# Patient Record
Sex: Female | Born: 1940
Health system: Southern US, Community
[De-identification: ages and names within clinical notes are randomized; demographics above are authoritative.]

## PROBLEM LIST (undated history)

## (undated) DIAGNOSIS — F53 Postpartum depression: Secondary | ICD-10-CM

## (undated) DIAGNOSIS — Z5900 Homelessness unspecified: Secondary | ICD-10-CM

## (undated) DIAGNOSIS — I10 Essential (primary) hypertension: Secondary | ICD-10-CM

## (undated) DIAGNOSIS — S329XXA Fracture of unspecified parts of lumbosacral spine and pelvis, initial encounter for closed fracture: Secondary | ICD-10-CM

## (undated) DIAGNOSIS — O99345 Other mental disorders complicating the puerperium: Secondary | ICD-10-CM

## (undated) DIAGNOSIS — S72009A Fracture of unspecified part of neck of unspecified femur, initial encounter for closed fracture: Secondary | ICD-10-CM

---

## 2000-03-06 ENCOUNTER — Emergency Department (HOSPITAL_COMMUNITY): Admission: EM | Admit: 2000-03-06 | Discharge: 2000-03-06 | Payer: Self-pay | Admitting: Emergency Medicine

## 2005-03-12 ENCOUNTER — Ambulatory Visit: Payer: Self-pay | Admitting: Family Medicine

## 2006-03-28 ENCOUNTER — Emergency Department (HOSPITAL_COMMUNITY): Admission: EM | Admit: 2006-03-28 | Discharge: 2006-03-28 | Payer: Self-pay | Admitting: Emergency Medicine

## 2010-03-06 ENCOUNTER — Emergency Department: Payer: Self-pay | Admitting: Emergency Medicine

## 2015-11-08 DIAGNOSIS — F17210 Nicotine dependence, cigarettes, uncomplicated: Secondary | ICD-10-CM | POA: Diagnosis not present

## 2015-11-08 DIAGNOSIS — R9431 Abnormal electrocardiogram [ECG] [EKG]: Secondary | ICD-10-CM | POA: Diagnosis not present

## 2015-11-08 DIAGNOSIS — J069 Acute upper respiratory infection, unspecified: Secondary | ICD-10-CM | POA: Diagnosis not present

## 2015-11-08 DIAGNOSIS — R05 Cough: Secondary | ICD-10-CM | POA: Diagnosis not present

## 2015-11-08 DIAGNOSIS — R0602 Shortness of breath: Secondary | ICD-10-CM | POA: Diagnosis not present

## 2015-11-08 DIAGNOSIS — I10 Essential (primary) hypertension: Secondary | ICD-10-CM | POA: Diagnosis not present

## 2015-11-09 DIAGNOSIS — R9431 Abnormal electrocardiogram [ECG] [EKG]: Secondary | ICD-10-CM | POA: Diagnosis not present

## 2015-12-11 DIAGNOSIS — J9811 Atelectasis: Secondary | ICD-10-CM | POA: Diagnosis not present

## 2015-12-11 DIAGNOSIS — Z888 Allergy status to other drugs, medicaments and biological substances status: Secondary | ICD-10-CM | POA: Diagnosis not present

## 2015-12-11 DIAGNOSIS — Z598 Other problems related to housing and economic circumstances: Secondary | ICD-10-CM | POA: Diagnosis not present

## 2015-12-11 DIAGNOSIS — Z87891 Personal history of nicotine dependence: Secondary | ICD-10-CM | POA: Diagnosis not present

## 2015-12-11 DIAGNOSIS — Z9981 Dependence on supplemental oxygen: Secondary | ICD-10-CM | POA: Diagnosis not present

## 2015-12-11 DIAGNOSIS — M1250 Traumatic arthropathy, unspecified site: Secondary | ICD-10-CM | POA: Diagnosis not present

## 2015-12-11 DIAGNOSIS — R0902 Hypoxemia: Secondary | ICD-10-CM | POA: Diagnosis not present

## 2015-12-11 DIAGNOSIS — R0989 Other specified symptoms and signs involving the circulatory and respiratory systems: Secondary | ICD-10-CM | POA: Diagnosis not present

## 2015-12-11 DIAGNOSIS — F4310 Post-traumatic stress disorder, unspecified: Secondary | ICD-10-CM | POA: Diagnosis not present

## 2015-12-11 DIAGNOSIS — G8311 Monoplegia of lower limb affecting right dominant side: Secondary | ICD-10-CM | POA: Diagnosis not present

## 2015-12-11 DIAGNOSIS — T415X6A Underdosing of therapeutic gases, initial encounter: Secondary | ICD-10-CM | POA: Diagnosis not present

## 2015-12-13 DIAGNOSIS — I10 Essential (primary) hypertension: Secondary | ICD-10-CM | POA: Diagnosis not present

## 2015-12-13 DIAGNOSIS — R0609 Other forms of dyspnea: Secondary | ICD-10-CM | POA: Diagnosis not present

## 2015-12-13 DIAGNOSIS — R7981 Abnormal blood-gas level: Secondary | ICD-10-CM | POA: Diagnosis not present

## 2015-12-16 DIAGNOSIS — J40 Bronchitis, not specified as acute or chronic: Secondary | ICD-10-CM | POA: Diagnosis not present

## 2015-12-16 DIAGNOSIS — Z9981 Dependence on supplemental oxygen: Secondary | ICD-10-CM | POA: Diagnosis not present

## 2015-12-16 DIAGNOSIS — R918 Other nonspecific abnormal finding of lung field: Secondary | ICD-10-CM | POA: Diagnosis not present

## 2015-12-16 DIAGNOSIS — J811 Chronic pulmonary edema: Secondary | ICD-10-CM | POA: Diagnosis not present

## 2015-12-16 DIAGNOSIS — R05 Cough: Secondary | ICD-10-CM | POA: Diagnosis not present

## 2015-12-16 DIAGNOSIS — Z87891 Personal history of nicotine dependence: Secondary | ICD-10-CM | POA: Diagnosis not present

## 2015-12-16 DIAGNOSIS — R0602 Shortness of breath: Secondary | ICD-10-CM | POA: Diagnosis not present

## 2015-12-16 DIAGNOSIS — Z91120 Patient's intentional underdosing of medication regimen due to financial hardship: Secondary | ICD-10-CM | POA: Diagnosis not present

## 2015-12-16 DIAGNOSIS — Z888 Allergy status to other drugs, medicaments and biological substances status: Secondary | ICD-10-CM | POA: Diagnosis not present

## 2015-12-16 DIAGNOSIS — Z79899 Other long term (current) drug therapy: Secondary | ICD-10-CM | POA: Diagnosis not present

## 2015-12-16 DIAGNOSIS — T415X6A Underdosing of therapeutic gases, initial encounter: Secondary | ICD-10-CM | POA: Diagnosis not present

## 2015-12-20 DIAGNOSIS — J9611 Chronic respiratory failure with hypoxia: Secondary | ICD-10-CM | POA: Diagnosis not present

## 2015-12-20 DIAGNOSIS — M1990 Unspecified osteoarthritis, unspecified site: Secondary | ICD-10-CM | POA: Diagnosis not present

## 2015-12-20 DIAGNOSIS — R918 Other nonspecific abnormal finding of lung field: Secondary | ICD-10-CM | POA: Diagnosis not present

## 2016-02-14 DIAGNOSIS — Z8709 Personal history of other diseases of the respiratory system: Secondary | ICD-10-CM | POA: Diagnosis not present

## 2016-02-14 DIAGNOSIS — Z87891 Personal history of nicotine dependence: Secondary | ICD-10-CM | POA: Diagnosis not present

## 2016-02-14 DIAGNOSIS — Z139 Encounter for screening, unspecified: Secondary | ICD-10-CM | POA: Diagnosis not present

## 2016-02-14 DIAGNOSIS — Z888 Allergy status to other drugs, medicaments and biological substances status: Secondary | ICD-10-CM | POA: Diagnosis not present

## 2016-02-14 DIAGNOSIS — Z0000 Encounter for general adult medical examination without abnormal findings: Secondary | ICD-10-CM | POA: Diagnosis not present

## 2016-02-14 DIAGNOSIS — Z9981 Dependence on supplemental oxygen: Secondary | ICD-10-CM | POA: Diagnosis not present

## 2016-02-14 DIAGNOSIS — Z79899 Other long term (current) drug therapy: Secondary | ICD-10-CM | POA: Diagnosis not present

## 2016-02-18 DIAGNOSIS — G839 Paralytic syndrome, unspecified: Secondary | ICD-10-CM | POA: Diagnosis not present

## 2016-02-18 DIAGNOSIS — M1990 Unspecified osteoarthritis, unspecified site: Secondary | ICD-10-CM | POA: Diagnosis not present

## 2016-02-18 DIAGNOSIS — J9611 Chronic respiratory failure with hypoxia: Secondary | ICD-10-CM | POA: Diagnosis not present

## 2016-02-18 DIAGNOSIS — E669 Obesity, unspecified: Secondary | ICD-10-CM | POA: Diagnosis not present

## 2016-02-18 DIAGNOSIS — Z9181 History of falling: Secondary | ICD-10-CM | POA: Diagnosis not present

## 2016-02-18 DIAGNOSIS — I10 Essential (primary) hypertension: Secondary | ICD-10-CM | POA: Diagnosis not present

## 2016-03-04 DIAGNOSIS — E669 Obesity, unspecified: Secondary | ICD-10-CM | POA: Diagnosis not present

## 2016-03-04 DIAGNOSIS — M1990 Unspecified osteoarthritis, unspecified site: Secondary | ICD-10-CM | POA: Diagnosis not present

## 2016-03-04 DIAGNOSIS — Z9181 History of falling: Secondary | ICD-10-CM | POA: Diagnosis not present

## 2016-03-04 DIAGNOSIS — R0609 Other forms of dyspnea: Secondary | ICD-10-CM | POA: Diagnosis not present

## 2016-03-04 DIAGNOSIS — Z4689 Encounter for fitting and adjustment of other specified devices: Secondary | ICD-10-CM | POA: Diagnosis not present

## 2016-03-12 DIAGNOSIS — Z9119 Patient's noncompliance with other medical treatment and regimen: Secondary | ICD-10-CM | POA: Diagnosis not present

## 2016-03-12 DIAGNOSIS — J9611 Chronic respiratory failure with hypoxia: Secondary | ICD-10-CM | POA: Diagnosis not present

## 2016-03-12 DIAGNOSIS — F99 Mental disorder, not otherwise specified: Secondary | ICD-10-CM | POA: Diagnosis not present

## 2016-03-12 DIAGNOSIS — T7431XA Adult psychological abuse, confirmed, initial encounter: Secondary | ICD-10-CM | POA: Diagnosis not present

## 2016-03-12 DIAGNOSIS — I10 Essential (primary) hypertension: Secondary | ICD-10-CM | POA: Diagnosis not present

## 2016-03-28 DIAGNOSIS — I2699 Other pulmonary embolism without acute cor pulmonale: Secondary | ICD-10-CM | POA: Diagnosis not present

## 2016-03-28 DIAGNOSIS — R918 Other nonspecific abnormal finding of lung field: Secondary | ICD-10-CM | POA: Diagnosis not present

## 2016-03-28 DIAGNOSIS — Z6841 Body Mass Index (BMI) 40.0 and over, adult: Secondary | ICD-10-CM | POA: Diagnosis not present

## 2016-03-28 DIAGNOSIS — J9811 Atelectasis: Secondary | ICD-10-CM | POA: Diagnosis not present

## 2016-03-28 DIAGNOSIS — N390 Urinary tract infection, site not specified: Secondary | ICD-10-CM | POA: Diagnosis not present

## 2016-03-28 DIAGNOSIS — R600 Localized edema: Secondary | ICD-10-CM | POA: Diagnosis not present

## 2016-03-28 DIAGNOSIS — I2510 Atherosclerotic heart disease of native coronary artery without angina pectoris: Secondary | ICD-10-CM | POA: Diagnosis not present

## 2016-03-28 DIAGNOSIS — R0602 Shortness of breath: Secondary | ICD-10-CM | POA: Diagnosis not present

## 2016-03-28 DIAGNOSIS — E669 Obesity, unspecified: Secondary | ICD-10-CM | POA: Diagnosis not present

## 2016-03-28 DIAGNOSIS — I159 Secondary hypertension, unspecified: Secondary | ICD-10-CM | POA: Diagnosis not present

## 2016-03-28 DIAGNOSIS — I499 Cardiac arrhythmia, unspecified: Secondary | ICD-10-CM | POA: Diagnosis not present

## 2016-03-28 DIAGNOSIS — R0989 Other specified symptoms and signs involving the circulatory and respiratory systems: Secondary | ICD-10-CM | POA: Diagnosis not present

## 2016-03-28 DIAGNOSIS — I119 Hypertensive heart disease without heart failure: Secondary | ICD-10-CM | POA: Diagnosis not present

## 2016-03-28 DIAGNOSIS — D649 Anemia, unspecified: Secondary | ICD-10-CM | POA: Diagnosis not present

## 2016-03-28 DIAGNOSIS — M7989 Other specified soft tissue disorders: Secondary | ICD-10-CM | POA: Diagnosis not present

## 2016-03-28 DIAGNOSIS — R911 Solitary pulmonary nodule: Secondary | ICD-10-CM | POA: Diagnosis not present

## 2016-03-28 DIAGNOSIS — M12551 Traumatic arthropathy, right hip: Secondary | ICD-10-CM | POA: Diagnosis not present

## 2016-03-29 DIAGNOSIS — R0602 Shortness of breath: Secondary | ICD-10-CM | POA: Diagnosis not present

## 2016-03-29 DIAGNOSIS — M1611 Unilateral primary osteoarthritis, right hip: Secondary | ICD-10-CM | POA: Diagnosis not present

## 2016-03-29 DIAGNOSIS — I890 Lymphedema, not elsewhere classified: Secondary | ICD-10-CM | POA: Diagnosis present

## 2016-03-29 DIAGNOSIS — R911 Solitary pulmonary nodule: Secondary | ICD-10-CM | POA: Diagnosis present

## 2016-03-29 DIAGNOSIS — R600 Localized edema: Secondary | ICD-10-CM | POA: Diagnosis not present

## 2016-03-29 DIAGNOSIS — R262 Difficulty in walking, not elsewhere classified: Secondary | ICD-10-CM | POA: Diagnosis not present

## 2016-03-29 DIAGNOSIS — R41841 Cognitive communication deficit: Secondary | ICD-10-CM | POA: Diagnosis not present

## 2016-03-29 DIAGNOSIS — I159 Secondary hypertension, unspecified: Secondary | ICD-10-CM | POA: Diagnosis not present

## 2016-03-29 DIAGNOSIS — D509 Iron deficiency anemia, unspecified: Secondary | ICD-10-CM | POA: Diagnosis not present

## 2016-03-29 DIAGNOSIS — M12551 Traumatic arthropathy, right hip: Secondary | ICD-10-CM | POA: Diagnosis present

## 2016-03-29 DIAGNOSIS — Z6841 Body Mass Index (BMI) 40.0 and over, adult: Secondary | ICD-10-CM | POA: Diagnosis not present

## 2016-03-29 DIAGNOSIS — I2510 Atherosclerotic heart disease of native coronary artery without angina pectoris: Secondary | ICD-10-CM | POA: Diagnosis present

## 2016-03-29 DIAGNOSIS — N390 Urinary tract infection, site not specified: Secondary | ICD-10-CM | POA: Diagnosis present

## 2016-03-29 DIAGNOSIS — Z87891 Personal history of nicotine dependence: Secondary | ICD-10-CM | POA: Diagnosis not present

## 2016-03-29 DIAGNOSIS — M6281 Muscle weakness (generalized): Secondary | ICD-10-CM | POA: Diagnosis not present

## 2016-03-29 DIAGNOSIS — E669 Obesity, unspecified: Secondary | ICD-10-CM | POA: Diagnosis not present

## 2016-03-29 DIAGNOSIS — I119 Hypertensive heart disease without heart failure: Secondary | ICD-10-CM | POA: Diagnosis present

## 2016-03-29 DIAGNOSIS — Z888 Allergy status to other drugs, medicaments and biological substances status: Secondary | ICD-10-CM | POA: Diagnosis not present

## 2016-03-29 DIAGNOSIS — J9611 Chronic respiratory failure with hypoxia: Secondary | ICD-10-CM | POA: Diagnosis not present

## 2016-03-29 DIAGNOSIS — D649 Anemia, unspecified: Secondary | ICD-10-CM | POA: Diagnosis present

## 2016-03-29 DIAGNOSIS — R488 Other symbolic dysfunctions: Secondary | ICD-10-CM | POA: Diagnosis not present

## 2016-03-29 DIAGNOSIS — Z79899 Other long term (current) drug therapy: Secondary | ICD-10-CM | POA: Diagnosis not present

## 2016-03-29 DIAGNOSIS — I2699 Other pulmonary embolism without acute cor pulmonale: Secondary | ICD-10-CM | POA: Diagnosis present

## 2016-03-29 DIAGNOSIS — I10 Essential (primary) hypertension: Secondary | ICD-10-CM | POA: Diagnosis not present

## 2016-03-29 DIAGNOSIS — K769 Liver disease, unspecified: Secondary | ICD-10-CM | POA: Diagnosis present

## 2016-04-03 DIAGNOSIS — E669 Obesity, unspecified: Secondary | ICD-10-CM | POA: Diagnosis not present

## 2016-04-03 DIAGNOSIS — R102 Pelvic and perineal pain: Secondary | ICD-10-CM | POA: Diagnosis not present

## 2016-04-03 DIAGNOSIS — R05 Cough: Secondary | ICD-10-CM | POA: Diagnosis not present

## 2016-04-03 DIAGNOSIS — R932 Abnormal findings on diagnostic imaging of liver and biliary tract: Secondary | ICD-10-CM | POA: Diagnosis not present

## 2016-04-03 DIAGNOSIS — M79674 Pain in right toe(s): Secondary | ICD-10-CM | POA: Diagnosis not present

## 2016-04-03 DIAGNOSIS — M545 Low back pain: Secondary | ICD-10-CM | POA: Diagnosis not present

## 2016-04-03 DIAGNOSIS — R911 Solitary pulmonary nodule: Secondary | ICD-10-CM | POA: Diagnosis not present

## 2016-04-03 DIAGNOSIS — M6281 Muscle weakness (generalized): Secondary | ICD-10-CM | POA: Diagnosis not present

## 2016-04-03 DIAGNOSIS — I70203 Unspecified atherosclerosis of native arteries of extremities, bilateral legs: Secondary | ICD-10-CM | POA: Diagnosis not present

## 2016-04-03 DIAGNOSIS — I159 Secondary hypertension, unspecified: Secondary | ICD-10-CM | POA: Diagnosis not present

## 2016-04-03 DIAGNOSIS — Z87891 Personal history of nicotine dependence: Secondary | ICD-10-CM | POA: Diagnosis not present

## 2016-04-03 DIAGNOSIS — R262 Difficulty in walking, not elsewhere classified: Secondary | ICD-10-CM | POA: Diagnosis not present

## 2016-04-03 DIAGNOSIS — R0602 Shortness of breath: Secondary | ICD-10-CM | POA: Diagnosis not present

## 2016-04-03 DIAGNOSIS — R41841 Cognitive communication deficit: Secondary | ICD-10-CM | POA: Diagnosis not present

## 2016-04-03 DIAGNOSIS — G8929 Other chronic pain: Secondary | ICD-10-CM | POA: Diagnosis not present

## 2016-04-03 DIAGNOSIS — M1611 Unilateral primary osteoarthritis, right hip: Secondary | ICD-10-CM | POA: Diagnosis not present

## 2016-04-03 DIAGNOSIS — I2699 Other pulmonary embolism without acute cor pulmonale: Secondary | ICD-10-CM | POA: Diagnosis not present

## 2016-04-03 DIAGNOSIS — I10 Essential (primary) hypertension: Secondary | ICD-10-CM | POA: Diagnosis not present

## 2016-04-03 DIAGNOSIS — R488 Other symbolic dysfunctions: Secondary | ICD-10-CM | POA: Diagnosis not present

## 2016-04-03 DIAGNOSIS — M1990 Unspecified osteoarthritis, unspecified site: Secondary | ICD-10-CM | POA: Diagnosis not present

## 2016-04-03 DIAGNOSIS — B351 Tinea unguium: Secondary | ICD-10-CM | POA: Diagnosis not present

## 2016-04-03 DIAGNOSIS — J9611 Chronic respiratory failure with hypoxia: Secondary | ICD-10-CM | POA: Diagnosis not present

## 2016-04-03 DIAGNOSIS — R600 Localized edema: Secondary | ICD-10-CM | POA: Diagnosis not present

## 2016-04-04 DIAGNOSIS — I159 Secondary hypertension, unspecified: Secondary | ICD-10-CM | POA: Diagnosis not present

## 2016-04-04 DIAGNOSIS — I2699 Other pulmonary embolism without acute cor pulmonale: Secondary | ICD-10-CM | POA: Diagnosis not present

## 2016-04-07 DIAGNOSIS — M1611 Unilateral primary osteoarthritis, right hip: Secondary | ICD-10-CM | POA: Diagnosis not present

## 2016-04-07 DIAGNOSIS — R102 Pelvic and perineal pain: Secondary | ICD-10-CM | POA: Diagnosis not present

## 2016-04-07 DIAGNOSIS — M545 Low back pain: Secondary | ICD-10-CM | POA: Diagnosis not present

## 2016-04-07 DIAGNOSIS — G8929 Other chronic pain: Secondary | ICD-10-CM | POA: Diagnosis not present

## 2016-04-07 DIAGNOSIS — M1990 Unspecified osteoarthritis, unspecified site: Secondary | ICD-10-CM | POA: Diagnosis not present

## 2016-04-08 DIAGNOSIS — I2699 Other pulmonary embolism without acute cor pulmonale: Secondary | ICD-10-CM | POA: Diagnosis not present

## 2016-04-08 DIAGNOSIS — I10 Essential (primary) hypertension: Secondary | ICD-10-CM | POA: Diagnosis not present

## 2016-04-08 DIAGNOSIS — M1611 Unilateral primary osteoarthritis, right hip: Secondary | ICD-10-CM | POA: Diagnosis not present

## 2016-04-08 DIAGNOSIS — J9611 Chronic respiratory failure with hypoxia: Secondary | ICD-10-CM | POA: Diagnosis not present

## 2016-04-09 DIAGNOSIS — M1611 Unilateral primary osteoarthritis, right hip: Secondary | ICD-10-CM | POA: Diagnosis not present

## 2016-04-21 DIAGNOSIS — I10 Essential (primary) hypertension: Secondary | ICD-10-CM | POA: Diagnosis not present

## 2016-04-21 DIAGNOSIS — I2699 Other pulmonary embolism without acute cor pulmonale: Secondary | ICD-10-CM | POA: Diagnosis not present

## 2016-04-21 DIAGNOSIS — R932 Abnormal findings on diagnostic imaging of liver and biliary tract: Secondary | ICD-10-CM | POA: Diagnosis not present

## 2016-04-21 DIAGNOSIS — R600 Localized edema: Secondary | ICD-10-CM | POA: Diagnosis not present

## 2016-04-25 DIAGNOSIS — B351 Tinea unguium: Secondary | ICD-10-CM | POA: Diagnosis not present

## 2016-04-25 DIAGNOSIS — R262 Difficulty in walking, not elsewhere classified: Secondary | ICD-10-CM | POA: Diagnosis not present

## 2016-04-25 DIAGNOSIS — M79674 Pain in right toe(s): Secondary | ICD-10-CM | POA: Diagnosis not present

## 2016-04-25 DIAGNOSIS — I70203 Unspecified atherosclerosis of native arteries of extremities, bilateral legs: Secondary | ICD-10-CM | POA: Diagnosis not present

## 2016-05-19 DIAGNOSIS — R05 Cough: Secondary | ICD-10-CM | POA: Diagnosis not present

## 2016-05-21 DIAGNOSIS — R932 Abnormal findings on diagnostic imaging of liver and biliary tract: Secondary | ICD-10-CM | POA: Diagnosis not present

## 2016-05-21 DIAGNOSIS — R911 Solitary pulmonary nodule: Secondary | ICD-10-CM | POA: Diagnosis not present

## 2016-05-21 DIAGNOSIS — M1611 Unilateral primary osteoarthritis, right hip: Secondary | ICD-10-CM | POA: Diagnosis not present

## 2016-05-21 DIAGNOSIS — I10 Essential (primary) hypertension: Secondary | ICD-10-CM | POA: Diagnosis not present

## 2016-06-04 DIAGNOSIS — I498 Other specified cardiac arrhythmias: Secondary | ICD-10-CM | POA: Diagnosis not present

## 2016-06-04 DIAGNOSIS — R109 Unspecified abdominal pain: Secondary | ICD-10-CM | POA: Diagnosis not present

## 2016-06-04 DIAGNOSIS — R300 Dysuria: Secondary | ICD-10-CM | POA: Diagnosis not present

## 2016-06-04 DIAGNOSIS — F4310 Post-traumatic stress disorder, unspecified: Secondary | ICD-10-CM | POA: Diagnosis not present

## 2016-06-04 DIAGNOSIS — I10 Essential (primary) hypertension: Secondary | ICD-10-CM | POA: Diagnosis not present

## 2016-06-04 DIAGNOSIS — R9431 Abnormal electrocardiogram [ECG] [EKG]: Secondary | ICD-10-CM | POA: Diagnosis not present

## 2016-06-04 DIAGNOSIS — R0602 Shortness of breath: Secondary | ICD-10-CM | POA: Diagnosis not present

## 2016-06-04 DIAGNOSIS — R001 Bradycardia, unspecified: Secondary | ICD-10-CM | POA: Diagnosis not present

## 2016-06-04 DIAGNOSIS — F17210 Nicotine dependence, cigarettes, uncomplicated: Secondary | ICD-10-CM | POA: Diagnosis not present

## 2016-06-04 DIAGNOSIS — I499 Cardiac arrhythmia, unspecified: Secondary | ICD-10-CM | POA: Diagnosis not present

## 2016-06-15 DIAGNOSIS — M79605 Pain in left leg: Secondary | ICD-10-CM | POA: Diagnosis not present

## 2016-06-15 DIAGNOSIS — M7989 Other specified soft tissue disorders: Secondary | ICD-10-CM | POA: Diagnosis not present

## 2016-06-15 DIAGNOSIS — Z79899 Other long term (current) drug therapy: Secondary | ICD-10-CM | POA: Diagnosis not present

## 2016-06-15 DIAGNOSIS — Z87891 Personal history of nicotine dependence: Secondary | ICD-10-CM | POA: Diagnosis not present

## 2016-06-15 DIAGNOSIS — G839 Paralytic syndrome, unspecified: Secondary | ICD-10-CM | POA: Diagnosis not present

## 2016-06-15 DIAGNOSIS — M79606 Pain in leg, unspecified: Secondary | ICD-10-CM | POA: Diagnosis not present

## 2016-06-15 DIAGNOSIS — M79604 Pain in right leg: Secondary | ICD-10-CM | POA: Diagnosis not present

## 2016-06-15 DIAGNOSIS — G8929 Other chronic pain: Secondary | ICD-10-CM | POA: Diagnosis not present

## 2016-06-15 DIAGNOSIS — M1990 Unspecified osteoarthritis, unspecified site: Secondary | ICD-10-CM | POA: Diagnosis not present

## 2016-06-15 DIAGNOSIS — Z888 Allergy status to other drugs, medicaments and biological substances status: Secondary | ICD-10-CM | POA: Diagnosis not present

## 2016-06-17 DIAGNOSIS — G8191 Hemiplegia, unspecified affecting right dominant side: Secondary | ICD-10-CM | POA: Diagnosis not present

## 2016-06-17 DIAGNOSIS — M1990 Unspecified osteoarthritis, unspecified site: Secondary | ICD-10-CM | POA: Diagnosis not present

## 2016-06-17 DIAGNOSIS — Z791 Long term (current) use of non-steroidal anti-inflammatories (NSAID): Secondary | ICD-10-CM | POA: Diagnosis not present

## 2016-06-17 DIAGNOSIS — J069 Acute upper respiratory infection, unspecified: Secondary | ICD-10-CM | POA: Diagnosis not present

## 2016-06-17 DIAGNOSIS — Z87891 Personal history of nicotine dependence: Secondary | ICD-10-CM | POA: Diagnosis not present

## 2016-06-17 DIAGNOSIS — F4311 Post-traumatic stress disorder, acute: Secondary | ICD-10-CM | POA: Diagnosis not present

## 2016-06-17 DIAGNOSIS — J3489 Other specified disorders of nose and nasal sinuses: Secondary | ICD-10-CM | POA: Diagnosis not present

## 2016-06-17 DIAGNOSIS — R05 Cough: Secondary | ICD-10-CM | POA: Diagnosis not present

## 2016-06-17 DIAGNOSIS — F4310 Post-traumatic stress disorder, unspecified: Secondary | ICD-10-CM | POA: Diagnosis not present

## 2016-06-17 DIAGNOSIS — Z9981 Dependence on supplemental oxygen: Secondary | ICD-10-CM | POA: Diagnosis not present

## 2016-06-17 DIAGNOSIS — Z79891 Long term (current) use of opiate analgesic: Secondary | ICD-10-CM | POA: Diagnosis not present

## 2016-06-17 DIAGNOSIS — I517 Cardiomegaly: Secondary | ICD-10-CM | POA: Diagnosis not present

## 2016-06-17 DIAGNOSIS — Z79899 Other long term (current) drug therapy: Secondary | ICD-10-CM | POA: Diagnosis not present

## 2016-06-24 DIAGNOSIS — M1990 Unspecified osteoarthritis, unspecified site: Secondary | ICD-10-CM | POA: Diagnosis not present

## 2016-06-24 DIAGNOSIS — Z4689 Encounter for fitting and adjustment of other specified devices: Secondary | ICD-10-CM | POA: Diagnosis not present

## 2016-06-24 DIAGNOSIS — Z6841 Body Mass Index (BMI) 40.0 and over, adult: Secondary | ICD-10-CM | POA: Diagnosis not present

## 2016-06-24 DIAGNOSIS — E669 Obesity, unspecified: Secondary | ICD-10-CM | POA: Diagnosis not present

## 2016-06-24 DIAGNOSIS — R0602 Shortness of breath: Secondary | ICD-10-CM | POA: Diagnosis not present

## 2016-06-26 DIAGNOSIS — Z760 Encounter for issue of repeat prescription: Secondary | ICD-10-CM | POA: Diagnosis not present

## 2016-06-26 DIAGNOSIS — M1990 Unspecified osteoarthritis, unspecified site: Secondary | ICD-10-CM | POA: Diagnosis not present

## 2016-06-26 DIAGNOSIS — Z888 Allergy status to other drugs, medicaments and biological substances status: Secondary | ICD-10-CM | POA: Diagnosis not present

## 2016-06-26 DIAGNOSIS — M25552 Pain in left hip: Secondary | ICD-10-CM | POA: Diagnosis not present

## 2016-06-26 DIAGNOSIS — M25562 Pain in left knee: Secondary | ICD-10-CM | POA: Diagnosis not present

## 2016-06-26 DIAGNOSIS — F4310 Post-traumatic stress disorder, unspecified: Secondary | ICD-10-CM | POA: Diagnosis not present

## 2016-06-26 DIAGNOSIS — M25561 Pain in right knee: Secondary | ICD-10-CM | POA: Diagnosis not present

## 2016-06-26 DIAGNOSIS — G8929 Other chronic pain: Secondary | ICD-10-CM | POA: Diagnosis not present

## 2016-06-26 DIAGNOSIS — M25551 Pain in right hip: Secondary | ICD-10-CM | POA: Diagnosis not present

## 2016-06-27 DIAGNOSIS — F4310 Post-traumatic stress disorder, unspecified: Secondary | ICD-10-CM | POA: Diagnosis not present

## 2016-06-27 DIAGNOSIS — F419 Anxiety disorder, unspecified: Secondary | ICD-10-CM | POA: Diagnosis not present

## 2016-06-27 DIAGNOSIS — F17210 Nicotine dependence, cigarettes, uncomplicated: Secondary | ICD-10-CM | POA: Diagnosis not present

## 2016-06-27 DIAGNOSIS — I10 Essential (primary) hypertension: Secondary | ICD-10-CM | POA: Diagnosis not present

## 2016-06-27 DIAGNOSIS — R05 Cough: Secondary | ICD-10-CM | POA: Diagnosis not present

## 2016-06-28 DIAGNOSIS — F419 Anxiety disorder, unspecified: Secondary | ICD-10-CM | POA: Diagnosis not present

## 2016-06-28 DIAGNOSIS — R05 Cough: Secondary | ICD-10-CM | POA: Diagnosis not present

## 2016-07-07 DIAGNOSIS — Z9981 Dependence on supplemental oxygen: Secondary | ICD-10-CM | POA: Diagnosis not present

## 2016-07-07 DIAGNOSIS — Z888 Allergy status to other drugs, medicaments and biological substances status: Secondary | ICD-10-CM | POA: Diagnosis not present

## 2016-07-07 DIAGNOSIS — S81801A Unspecified open wound, right lower leg, initial encounter: Secondary | ICD-10-CM | POA: Diagnosis not present

## 2016-07-07 DIAGNOSIS — M1250 Traumatic arthropathy, unspecified site: Secondary | ICD-10-CM | POA: Diagnosis not present

## 2016-07-07 DIAGNOSIS — T1490 Injury, unspecified: Secondary | ICD-10-CM | POA: Diagnosis not present

## 2016-07-07 DIAGNOSIS — Z79899 Other long term (current) drug therapy: Secondary | ICD-10-CM | POA: Diagnosis not present

## 2016-07-07 DIAGNOSIS — G8311 Monoplegia of lower limb affecting right dominant side: Secondary | ICD-10-CM | POA: Diagnosis not present

## 2016-07-07 DIAGNOSIS — M7989 Other specified soft tissue disorders: Secondary | ICD-10-CM | POA: Diagnosis not present

## 2016-07-07 DIAGNOSIS — F4310 Post-traumatic stress disorder, unspecified: Secondary | ICD-10-CM | POA: Diagnosis not present

## 2016-07-07 DIAGNOSIS — J40 Bronchitis, not specified as acute or chronic: Secondary | ICD-10-CM | POA: Diagnosis not present

## 2016-07-07 DIAGNOSIS — Z87891 Personal history of nicotine dependence: Secondary | ICD-10-CM | POA: Diagnosis not present

## 2016-07-07 DIAGNOSIS — R531 Weakness: Secondary | ICD-10-CM | POA: Diagnosis not present

## 2016-07-08 DIAGNOSIS — M7989 Other specified soft tissue disorders: Secondary | ICD-10-CM | POA: Diagnosis not present

## 2016-08-09 DIAGNOSIS — M1990 Unspecified osteoarthritis, unspecified site: Secondary | ICD-10-CM | POA: Diagnosis not present

## 2016-08-09 DIAGNOSIS — S3993XA Unspecified injury of pelvis, initial encounter: Secondary | ICD-10-CM | POA: Diagnosis not present

## 2016-08-09 DIAGNOSIS — Z87891 Personal history of nicotine dependence: Secondary | ICD-10-CM | POA: Diagnosis not present

## 2016-08-09 DIAGNOSIS — S80811A Abrasion, right lower leg, initial encounter: Secondary | ICD-10-CM | POA: Diagnosis not present

## 2016-08-09 DIAGNOSIS — F4310 Post-traumatic stress disorder, unspecified: Secondary | ICD-10-CM | POA: Diagnosis not present

## 2016-08-09 DIAGNOSIS — I10 Essential (primary) hypertension: Secondary | ICD-10-CM | POA: Diagnosis not present

## 2016-08-09 DIAGNOSIS — M25551 Pain in right hip: Secondary | ICD-10-CM | POA: Diagnosis not present

## 2016-08-09 DIAGNOSIS — M545 Low back pain: Secondary | ICD-10-CM | POA: Diagnosis not present

## 2016-08-15 DIAGNOSIS — I10 Essential (primary) hypertension: Secondary | ICD-10-CM | POA: Diagnosis not present

## 2016-08-15 DIAGNOSIS — Z87891 Personal history of nicotine dependence: Secondary | ICD-10-CM | POA: Diagnosis not present

## 2016-08-15 DIAGNOSIS — F4310 Post-traumatic stress disorder, unspecified: Secondary | ICD-10-CM | POA: Diagnosis not present

## 2016-08-15 DIAGNOSIS — Z5189 Encounter for other specified aftercare: Secondary | ICD-10-CM | POA: Diagnosis not present

## 2016-08-15 DIAGNOSIS — Z4800 Encounter for change or removal of nonsurgical wound dressing: Secondary | ICD-10-CM | POA: Diagnosis not present

## 2016-08-28 DIAGNOSIS — Z993 Dependence on wheelchair: Secondary | ICD-10-CM | POA: Diagnosis not present

## 2016-08-28 DIAGNOSIS — Z87891 Personal history of nicotine dependence: Secondary | ICD-10-CM | POA: Diagnosis not present

## 2016-08-28 DIAGNOSIS — G8191 Hemiplegia, unspecified affecting right dominant side: Secondary | ICD-10-CM | POA: Diagnosis not present

## 2016-08-28 DIAGNOSIS — Z888 Allergy status to other drugs, medicaments and biological substances status: Secondary | ICD-10-CM | POA: Diagnosis not present

## 2016-08-28 DIAGNOSIS — Z79899 Other long term (current) drug therapy: Secondary | ICD-10-CM | POA: Diagnosis not present

## 2016-08-28 DIAGNOSIS — Z6841 Body Mass Index (BMI) 40.0 and over, adult: Secondary | ICD-10-CM | POA: Diagnosis not present

## 2016-08-28 DIAGNOSIS — I10 Essential (primary) hypertension: Secondary | ICD-10-CM | POA: Diagnosis not present

## 2016-08-30 DIAGNOSIS — S80811A Abrasion, right lower leg, initial encounter: Secondary | ICD-10-CM | POA: Diagnosis not present

## 2016-08-30 DIAGNOSIS — Z87891 Personal history of nicotine dependence: Secondary | ICD-10-CM | POA: Diagnosis not present

## 2016-08-30 DIAGNOSIS — M79604 Pain in right leg: Secondary | ICD-10-CM | POA: Diagnosis not present

## 2016-08-30 DIAGNOSIS — I10 Essential (primary) hypertension: Secondary | ICD-10-CM | POA: Diagnosis not present

## 2016-08-30 DIAGNOSIS — F4310 Post-traumatic stress disorder, unspecified: Secondary | ICD-10-CM | POA: Diagnosis not present

## 2016-09-01 ENCOUNTER — Emergency Department (HOSPITAL_COMMUNITY)
Admission: EM | Admit: 2016-09-01 | Discharge: 2016-09-02 | Disposition: A | Discharge status: Home / Self Care | Payer: Medicare Other | Attending: Emergency Medicine | Admitting: Emergency Medicine

## 2016-09-01 ENCOUNTER — Encounter (HOSPITAL_COMMUNITY): Payer: Self-pay

## 2016-09-01 DIAGNOSIS — R32 Unspecified urinary incontinence: Secondary | ICD-10-CM | POA: Diagnosis not present

## 2016-09-01 HISTORY — DX: Fracture of unspecified parts of lumbosacral spine and pelvis, initial encounter for closed fracture: S32.9XXA

## 2016-09-01 HISTORY — DX: Fracture of unspecified part of neck of unspecified femur, initial encounter for closed fracture: S72.009A

## 2016-09-01 LAB — URINALYSIS, ROUTINE W REFLEX MICROSCOPIC
BILIRUBIN URINE: NEGATIVE
GLUCOSE, UA: NEGATIVE mg/dL
HGB URINE DIPSTICK: NEGATIVE
Ketones, ur: NEGATIVE mg/dL
Leukocytes, UA: NEGATIVE
Nitrite: NEGATIVE
PROTEIN: NEGATIVE mg/dL
Specific Gravity, Urine: 1.023 (ref 1.005–1.030)
pH: 6.5 (ref 5.0–8.0)

## 2016-09-01 NOTE — ED Provider Notes (Signed)
[facility]-EMERGENCY DEPT Provider Note   CSN: [id number] Arrival date & time: 09/01/16  2054     History   Chief Complaint Chief Complaint  Patient presents with  . Urinary Incontinence    HPI Beth Hall is a 75 y.o. female.  Patient states that she has an odor related to urinary incontinence. Claims that they will not let her ride on the bus. Patient recently came [place] by [employer] train and was trying take bus back to her [place] in [place]. Patient states that she has no family that she gets along with. Patient states that she's currently stranded here. Once suspended night in the hospital. Patient denies any other significant symptoms.      Past Medical History:  Diagnosis Date  . Hip fracture (HCC)   . Pelvic fracture (HCC)     There are no active problems to display for this patient.   History reviewed. No pertinent surgical history.  OB History    No data available       Home Medications    Prior to Admission medications   Not on File    Family History No family history on file.  Social History Social History  Substance Use Topics  . Smoking status: Not on file  . Smokeless tobacco: Not on file  . Alcohol use Not on file     Allergies   Patient has no allergy information on record.   Review of Systems Review of Systems  Constitutional: Negative for fever.  HENT: Negative for congestion.   Eyes: Negative for redness.  Respiratory: Negative for shortness of breath.   Cardiovascular: Negative for chest pain.  Gastrointestinal: Negative for abdominal pain.  Genitourinary: Negative for hematuria, vaginal bleeding and vaginal discharge.  Musculoskeletal: Negative for back pain.  Neurological: Negative for headaches.  Hematological: Does not bruise/bleed easily.     Physical Exam Updated Vital Signs BP 173/98 (BP Location: Right Arm)   Pulse 78   Temp 97.9 F (36.6 C) (Oral)   Resp 18   Ht 5\' 3"  (1.6 m)   Wt 113.4 kg   SpO2 94%    BMI 44.29 kg/m   Physical Exam  Constitutional: She appears well-developed and well-nourished. No distress.  HENT:  Head: Normocephalic and atraumatic.  Mouth/Throat: Oropharynx is clear and moist.  Eyes: Conjunctivae and EOM are normal. Pupils are equal, round, and reactive to light.  Neck: Normal range of motion. Neck supple.  Cardiovascular: Normal rate and regular rhythm.   Pulmonary/Chest: Effort normal and breath sounds normal. No respiratory distress.  Abdominal: Soft. Bowel sounds are normal. There is no tenderness.  Genitourinary: No vaginal discharge found.  Genitourinary Comments: No significant uterine prolapse. No skin breakdown in the vulvar area or in the creases of the thigh or abdomen. No discharge no vaginal bleeding.  Musculoskeletal: Normal range of motion. She exhibits edema.  She with the some skin tears where she bumped into her scooter chair on the right leg. Mild erythema to the left leg. No significant cellulitis.  Nursing note and vitals reviewed.    ED Treatments / Results  Labs (all labs ordered are listed, but only abnormal results are displayed) Labs Reviewed  URINALYSIS, ROUTINE W REFLEX MICROSCOPIC (NOT AT [facility])    EKG  EKG Interpretation None       Radiology No results found.  Procedures Procedures (including critical care time)  Medications Ordered in ED Medications - No data to display   Initial Impression / Assessment and Plan /  ED Course  I have reviewed the triage vital signs and the nursing notes.  Pertinent labs & imaging results that were available during my care of the patient were reviewed by me and considered in my medical decision making (see chart for details).  Clinical Course     Urinalysis negative for urinary tract infection. No significant skin breakdown. Patient is wearing a depends. There is some urine in there consistent with her history of urinary incontinence. Patient needs follow-up with GYN. States that  [place] in the [place]. [facility]. Nothing really on our chart review database for her being seen here. Patient would be given not clean depends. The chart is the CFO social worker can make contact with her. Patient stable for discharge home. No evidence of any significant uterine prolapse.  Final Clinical Impressions(s) / ED Diagnoses   Final diagnoses:  Urinary incontinence, unspecified type    New Prescriptions New Prescriptions   No medications on file     Vanetta MuldersScott Yossef Gilkison, MD 09/02/16 0004

## 2016-09-01 NOTE — Discharge Instructions (Signed)
No evidence of urinary tract infection. No significant breakdown from the urinary incontinence. Would recommend follow-up with GYN.  Charge nurse is going to see if they can arrange the social service follow-up for you.
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
## 2016-09-04 DIAGNOSIS — M25561 Pain in right knee: Secondary | ICD-10-CM | POA: Diagnosis not present

## 2016-09-04 DIAGNOSIS — I10 Essential (primary) hypertension: Secondary | ICD-10-CM | POA: Diagnosis not present

## 2016-09-04 DIAGNOSIS — Z87891 Personal history of nicotine dependence: Secondary | ICD-10-CM | POA: Diagnosis not present

## 2016-09-04 DIAGNOSIS — G8929 Other chronic pain: Secondary | ICD-10-CM | POA: Diagnosis not present

## 2016-09-04 DIAGNOSIS — M7651 Patellar tendinitis, right knee: Secondary | ICD-10-CM | POA: Diagnosis not present

## 2016-09-05 DIAGNOSIS — G8929 Other chronic pain: Secondary | ICD-10-CM | POA: Diagnosis not present

## 2016-09-05 DIAGNOSIS — M25561 Pain in right knee: Secondary | ICD-10-CM | POA: Diagnosis not present

## 2016-09-17 DIAGNOSIS — K769 Liver disease, unspecified: Secondary | ICD-10-CM | POA: Diagnosis not present

## 2016-09-17 DIAGNOSIS — Z23 Encounter for immunization: Secondary | ICD-10-CM | POA: Diagnosis not present

## 2016-09-17 DIAGNOSIS — R932 Abnormal findings on diagnostic imaging of liver and biliary tract: Secondary | ICD-10-CM | POA: Diagnosis not present

## 2016-09-17 DIAGNOSIS — Z7901 Long term (current) use of anticoagulants: Secondary | ICD-10-CM | POA: Diagnosis not present

## 2016-09-17 DIAGNOSIS — R0789 Other chest pain: Secondary | ICD-10-CM | POA: Diagnosis not present

## 2016-09-17 DIAGNOSIS — R079 Chest pain, unspecified: Secondary | ICD-10-CM | POA: Diagnosis not present

## 2016-09-17 DIAGNOSIS — I10 Essential (primary) hypertension: Secondary | ICD-10-CM | POA: Diagnosis not present

## 2016-09-17 DIAGNOSIS — D509 Iron deficiency anemia, unspecified: Secondary | ICD-10-CM | POA: Diagnosis not present

## 2016-09-17 DIAGNOSIS — R0782 Intercostal pain: Secondary | ICD-10-CM | POA: Diagnosis not present

## 2016-09-17 DIAGNOSIS — Z86711 Personal history of pulmonary embolism: Secondary | ICD-10-CM | POA: Diagnosis not present

## 2016-09-17 DIAGNOSIS — Z87891 Personal history of nicotine dependence: Secondary | ICD-10-CM | POA: Diagnosis not present

## 2016-09-17 DIAGNOSIS — R918 Other nonspecific abnormal finding of lung field: Secondary | ICD-10-CM | POA: Diagnosis not present

## 2016-09-17 DIAGNOSIS — F4310 Post-traumatic stress disorder, unspecified: Secondary | ICD-10-CM | POA: Diagnosis not present

## 2016-09-18 DIAGNOSIS — Z7901 Long term (current) use of anticoagulants: Secondary | ICD-10-CM | POA: Diagnosis not present

## 2016-09-18 DIAGNOSIS — R0789 Other chest pain: Secondary | ICD-10-CM | POA: Diagnosis not present

## 2016-09-18 DIAGNOSIS — R918 Other nonspecific abnormal finding of lung field: Secondary | ICD-10-CM | POA: Diagnosis not present

## 2016-09-18 DIAGNOSIS — R932 Abnormal findings on diagnostic imaging of liver and biliary tract: Secondary | ICD-10-CM | POA: Diagnosis not present

## 2016-09-18 DIAGNOSIS — D509 Iron deficiency anemia, unspecified: Secondary | ICD-10-CM | POA: Diagnosis not present

## 2016-09-18 DIAGNOSIS — I10 Essential (primary) hypertension: Secondary | ICD-10-CM | POA: Diagnosis not present

## 2016-09-18 DIAGNOSIS — Z86711 Personal history of pulmonary embolism: Secondary | ICD-10-CM | POA: Diagnosis not present

## 2016-09-18 DIAGNOSIS — R079 Chest pain, unspecified: Secondary | ICD-10-CM | POA: Diagnosis not present

## 2016-09-19 DIAGNOSIS — R911 Solitary pulmonary nodule: Secondary | ICD-10-CM | POA: Diagnosis not present

## 2016-09-19 DIAGNOSIS — Z993 Dependence on wheelchair: Secondary | ICD-10-CM | POA: Diagnosis not present

## 2016-09-19 DIAGNOSIS — R079 Chest pain, unspecified: Secondary | ICD-10-CM | POA: Diagnosis not present

## 2016-09-19 DIAGNOSIS — F4310 Post-traumatic stress disorder, unspecified: Secondary | ICD-10-CM | POA: Diagnosis not present

## 2016-09-19 DIAGNOSIS — I2782 Chronic pulmonary embolism: Secondary | ICD-10-CM | POA: Diagnosis not present

## 2016-09-19 DIAGNOSIS — Z7409 Other reduced mobility: Secondary | ICD-10-CM | POA: Diagnosis not present

## 2016-09-19 DIAGNOSIS — Z7901 Long term (current) use of anticoagulants: Secondary | ICD-10-CM | POA: Diagnosis not present

## 2016-09-19 DIAGNOSIS — R0782 Intercostal pain: Secondary | ICD-10-CM | POA: Diagnosis not present

## 2016-09-19 DIAGNOSIS — I10 Essential (primary) hypertension: Secondary | ICD-10-CM | POA: Diagnosis not present

## 2016-09-19 DIAGNOSIS — R160 Hepatomegaly, not elsewhere classified: Secondary | ICD-10-CM | POA: Diagnosis not present

## 2016-11-10 DIAGNOSIS — Z23 Encounter for immunization: Secondary | ICD-10-CM | POA: Diagnosis not present

## 2016-11-22 DIAGNOSIS — J029 Acute pharyngitis, unspecified: Secondary | ICD-10-CM | POA: Diagnosis not present

## 2016-11-22 DIAGNOSIS — I10 Essential (primary) hypertension: Secondary | ICD-10-CM | POA: Diagnosis not present

## 2016-11-22 DIAGNOSIS — R112 Nausea with vomiting, unspecified: Secondary | ICD-10-CM | POA: Diagnosis not present

## 2016-11-22 DIAGNOSIS — R05 Cough: Secondary | ICD-10-CM | POA: Diagnosis not present

## 2016-11-22 DIAGNOSIS — R42 Dizziness and giddiness: Secondary | ICD-10-CM | POA: Diagnosis not present

## 2016-11-22 DIAGNOSIS — R21 Rash and other nonspecific skin eruption: Secondary | ICD-10-CM | POA: Diagnosis not present

## 2016-11-22 DIAGNOSIS — M791 Myalgia: Secondary | ICD-10-CM | POA: Diagnosis not present

## 2016-11-22 DIAGNOSIS — R0602 Shortness of breath: Secondary | ICD-10-CM | POA: Diagnosis not present

## 2016-11-22 DIAGNOSIS — F4310 Post-traumatic stress disorder, unspecified: Secondary | ICD-10-CM | POA: Diagnosis not present

## 2016-11-22 DIAGNOSIS — J9811 Atelectasis: Secondary | ICD-10-CM | POA: Diagnosis not present

## 2016-11-22 DIAGNOSIS — Z87891 Personal history of nicotine dependence: Secondary | ICD-10-CM | POA: Diagnosis not present

## 2016-11-29 DIAGNOSIS — Z79899 Other long term (current) drug therapy: Secondary | ICD-10-CM | POA: Diagnosis not present

## 2016-11-29 DIAGNOSIS — Z7689 Persons encountering health services in other specified circumstances: Secondary | ICD-10-CM | POA: Diagnosis not present

## 2016-11-29 DIAGNOSIS — Z87891 Personal history of nicotine dependence: Secondary | ICD-10-CM | POA: Diagnosis not present

## 2016-11-29 DIAGNOSIS — Z888 Allergy status to other drugs, medicaments and biological substances status: Secondary | ICD-10-CM | POA: Diagnosis not present

## 2016-11-29 DIAGNOSIS — F4310 Post-traumatic stress disorder, unspecified: Secondary | ICD-10-CM | POA: Diagnosis not present

## 2016-11-29 DIAGNOSIS — Z008 Encounter for other general examination: Secondary | ICD-10-CM | POA: Diagnosis not present

## 2016-12-08 ENCOUNTER — Encounter: Payer: Self-pay | Admitting: Pediatric Intensive Care

## 2016-12-10 ENCOUNTER — Emergency Department (HOSPITAL_COMMUNITY)
Admission: EM | Admit: 2016-12-10 | Discharge: 2016-12-10 | Disposition: A | Discharge status: Home / Self Care | Payer: Medicare Other | Attending: Emergency Medicine | Admitting: Emergency Medicine

## 2016-12-10 ENCOUNTER — Emergency Department (HOSPITAL_COMMUNITY): Payer: Medicare Other

## 2016-12-10 ENCOUNTER — Encounter (HOSPITAL_COMMUNITY): Payer: Self-pay

## 2016-12-10 DIAGNOSIS — R05 Cough: Secondary | ICD-10-CM | POA: Diagnosis not present

## 2016-12-10 DIAGNOSIS — J209 Acute bronchitis, unspecified: Secondary | ICD-10-CM | POA: Diagnosis not present

## 2016-12-10 DIAGNOSIS — R066 Hiccough: Secondary | ICD-10-CM | POA: Diagnosis not present

## 2016-12-10 DIAGNOSIS — Z87891 Personal history of nicotine dependence: Secondary | ICD-10-CM | POA: Diagnosis not present

## 2016-12-10 DIAGNOSIS — I10 Essential (primary) hypertension: Secondary | ICD-10-CM | POA: Insufficient documentation

## 2016-12-10 DIAGNOSIS — R030 Elevated blood-pressure reading, without diagnosis of hypertension: Secondary | ICD-10-CM | POA: Diagnosis not present

## 2016-12-10 HISTORY — DX: Other mental disorders complicating the puerperium: O99.345

## 2016-12-10 HISTORY — DX: Postpartum depression: F53.0

## 2016-12-10 HISTORY — DX: Essential (primary) hypertension: I10

## 2016-12-10 MED ORDER — DM-GUAIFENESIN ER 30-600 MG PO TB12
1.0000 | ORAL_TABLET | Freq: Two times a day (BID) | ORAL | Status: DC
  Administered 2016-12-10: 1 via ORAL
  Filled 2016-12-10: qty 1

## 2016-12-10 MED ORDER — DM-GUAIFENESIN ER 30-600 MG PO TB12: 1.0000 | ORAL_TABLET | Freq: Two times a day (BID) | ORAL | 0 refills | Status: AC | PRN

## 2016-12-10 MED ORDER — AEROCHAMBER PLUS FLO-VU MEDIUM MISC
1.0000 | Freq: Once | Status: AC
  Administered 2016-12-10: 1
  Filled 2016-12-10: qty 1

## 2016-12-10 MED ORDER — ALBUTEROL SULFATE HFA 108 (90 BASE) MCG/ACT IN AERS
2.0000 | INHALATION_SPRAY | Freq: Once | RESPIRATORY_TRACT | Status: AC
  Administered 2016-12-10: 2 via RESPIRATORY_TRACT
  Filled 2016-12-10: qty 6.7

## 2016-12-10 NOTE — ED Notes (Signed)
Dispatch contacted for transport back to [facility].  Dispatch states she needs verification if [facility] will transport to the shelter

## 2016-12-10 NOTE — ED Notes (Signed)
Bed: [id number] Expected date:  Expected time:  Means of arrival:  Comments: 76 yo cough, congestion

## 2016-12-10 NOTE — ED Provider Notes (Signed)
[facility]-EMERGENCY DEPT Provider Note   CSN: [id number] Arrival date & time: 12/10/16  1524     History   Chief Complaint Chief Complaint  Patient presents with  . Cough    HPI Beth Hall is a 76 y.o. female.  The history is provided by the patient.  Cough  This is a new problem. Episode onset: 3 days ago. The problem occurs constantly. The problem has not changed since onset.The cough is productive of sputum. There has been no fever. Associated symptoms include chills and myalgias. Pertinent negatives include no sore throat and no shortness of breath (only with cough ). She has tried nothing for the symptoms. She is not a smoker. Her past medical history is significant for pneumonia.    Past Medical History:  Diagnosis Date  . Hip fracture (HCC)   . Hypertension   . Pelvic fracture (HCC)   . Post-partum depression     There are no active problems to display for this patient.   History reviewed. No pertinent surgical history.  OB History    No data available       Home Medications    Prior to Admission medications   Not on File    Family History History reviewed. No pertinent family history.  Social History Social History  Substance Use Topics  . Smoking status: Former Smoker    Quit date: 11/09/2013  . Smokeless tobacco: Never Used  . Alcohol use Yes     Comment: OCCASIONAL     Allergies   Haldol [haloperidol lactate]   Review of Systems Review of Systems  Constitutional: Positive for chills.  HENT: Negative for sore throat.   Respiratory: Positive for cough. Negative for shortness of breath (only with cough ).   Musculoskeletal: Positive for myalgias.  All other systems reviewed and are negative.    Physical Exam Updated Vital Signs BP 187/82 (BP Location: Right Arm)   Pulse 80   Temp 98.1 F (36.7 C) (Oral)   Resp 18   Ht 5\' 3"  (1.6 m)   Wt 200 lb (90.7 kg)   SpO2 97%   BMI 35.43 kg/m   Physical Exam  Constitutional: She is  oriented to person, place, and time. She appears well-developed and well-nourished. No distress.  HENT:  Head: Normocephalic.  Nose: Nose normal.  Mouth/Throat: Oropharynx is clear and moist.  Eyes: Conjunctivae are normal.  Neck: Neck supple. No tracheal deviation present.  Cardiovascular: Normal rate, regular rhythm and normal heart sounds.   Pulmonary/Chest: Effort normal. No respiratory distress. She has wheezes (faint end-expiratory).  Abdominal: Soft. She exhibits no distension. There is no tenderness.  Neurological: She is alert and oriented to person, place, and time.  Skin: Skin is warm and dry.  Psychiatric: She has a normal mood and affect.     ED Treatments / Results  Labs (all labs ordered are listed, but only abnormal results are displayed) Labs Reviewed - No data to display  EKG  EKG Interpretation None       Radiology Dg Chest 2 View  Result Date: 12/10/2016 CLINICAL DATA:  Cough, congestion, chills and weakness for 3 days chest x-ray of 03/06/2010 EXAM: CHEST  2 VIEW COMPARISON:  None. FINDINGS: The lungs are poorly aerated with under aeration of the lung bases but no definite pneumonia or effusion is seen. Mediastinal and hilar contours are unremarkable. Mild cardiomegaly is stable. There are degenerative changes throughout thoracic spine. IMPRESSION: Poor aeration with mild volume loss at the  lung bases. No definite active process. Electronically Signed   By: Dwyane DeePaul  Barry M.D.   On: 12/10/2016 17:15    Procedures Procedures (including critical care time)  Medications Ordered in ED Medications  dextromethorphan-guaiFENesin (MUCINEX DM) 30-600 MG per 12 hr tablet 1 tablet (1 tablet Oral Given 12/10/16 2001)  albuterol (PROVENTIL HFA;VENTOLIN HFA) 108 (90 Base) MCG/ACT inhaler 2 puff (2 puffs Inhalation Given 12/10/16 1647)  AEROCHAMBER PLUS FLO-VU MEDIUM MISC 1 each (1 each Other Given 12/10/16 1648)     Initial Impression / Assessment and Plan / ED Course    I have reviewed the triage vital signs and the nursing notes.  Pertinent labs & imaging results that were available during my care of the patient were reviewed by me and considered in my medical decision making (see chart for details).     76 y.o. female presents with cough over the last 3 days with congestion. No pneumonia on CXR. AFVSS. Suspect acute bronchitis, given albuterol MDI with spacer to help with symptoms and expectorant/cough suppression. Plan to follow up with PCP as needed and return precautions discussed for worsening or new concerning symptoms.   Final Clinical Impressions(s) / ED Diagnoses   Final diagnoses:  Acute bronchitis, unspecified organism    New Prescriptions Discharge Medication List as of 12/10/2016  5:32 PM    START taking these medications   Details  dextromethorphan-guaiFENesin (MUCINEX DM) 30-600 MG 12hr tablet Take 1 tablet by mouth 2 (two) times daily as needed for cough., Starting Wed 12/10/2016, Until Wed 12/24/2016, Print         Beth Pulleyaniel Vang Kraeger, MD 12/11/16 [phone]

## 2016-12-10 NOTE — ED Triage Notes (Signed)
PT RECEIVED FROM A [facility] C/O COUGH AND CONGESTION X3 DAYS. PER EMS, THE PT ASKED THE SHELTER FOR MEDICATION FOR HER COUGH, BUT THEY DID NOT HAVE ANY, NOR COULD THEY PROVIDE TRANSPORTATION TO THE HOSPITAL, SO THEY CALLED EMS. PT DENIES URINARY SYMPTOMS BUT HAS A STRONG SMELL OF URINE. PT STS, "I FEEL FORCED TO TAKE MEDICATION, SO I STOPPED TAKING THEM ON MY OWN."

## 2016-12-10 NOTE — ED Notes (Signed)
PT DISCHARGED. INSTRUCTIONS AND PRESCRIPTION GIVEN. AAOX4. PT IN NO APPARENT DISTRESS OR PAIN. THE OPPORTUNITY TO ASK QUESTIONS WAS PROVIDED. [facility] HERE TO [facility]

## 2016-12-12 ENCOUNTER — Encounter: Payer: Self-pay | Admitting: Pediatric Intensive Care
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
## 2016-12-23 DIAGNOSIS — Z79899 Other long term (current) drug therapy: Secondary | ICD-10-CM | POA: Diagnosis not present

## 2016-12-23 DIAGNOSIS — I10 Essential (primary) hypertension: Secondary | ICD-10-CM | POA: Diagnosis not present

## 2016-12-23 DIAGNOSIS — R918 Other nonspecific abnormal finding of lung field: Secondary | ICD-10-CM | POA: Diagnosis not present

## 2016-12-23 DIAGNOSIS — R05 Cough: Secondary | ICD-10-CM | POA: Diagnosis not present

## 2016-12-23 DIAGNOSIS — J069 Acute upper respiratory infection, unspecified: Secondary | ICD-10-CM | POA: Diagnosis not present

## 2016-12-23 DIAGNOSIS — Z888 Allergy status to other drugs, medicaments and biological substances status: Secondary | ICD-10-CM | POA: Diagnosis not present

## 2016-12-23 DIAGNOSIS — Z87891 Personal history of nicotine dependence: Secondary | ICD-10-CM | POA: Diagnosis not present

## 2016-12-23 DIAGNOSIS — R911 Solitary pulmonary nodule: Secondary | ICD-10-CM | POA: Diagnosis not present

## 2016-12-25 NOTE — Congregational Nurse Program (Signed)
Congregational Nurse Program Note  Date of Encounter: 12/12/2016  Past Medical History: Past Medical History:  Diagnosis Date  . Hip fracture (HCC)   . Hypertension   . Pelvic fracture (HCC)   . Post-partum depression     Encounter Details:     CNP Questionnaire - 12/12/16 0945      Patient Demographics   Is this a new or existing patient? Existing   Patient is considered a/an Not Applicable   Race African-American/Black     Patient Assistance   Location of Patient Assistance [facility]   Patient's financial/insurance status Medicaid;Medicare   Uninsured Patient ([employer] Research officer, trade unionCard/Care Connects) No   Patient referred to apply for the following financial assistance Not Applicable   Food insecurities addressed Not Applicable   Transportation assistance No   Assistance securing medications Yes   Type of Assistance [facility]   Educational health offerings Medications     Encounter Details   Primary purpose of visit Acute Illness/Condition Visit;Navigating the Healthcare System   Was an Emergency Department visit averted? Not Applicable   Does patient have a medical provider? No   Patient referred to Not Applicable   Was a mental health screening completed? (GAINS tool) No   Does patient have dental issues? No   Does patient have vision issues? No   Does your patient have an abnormal blood pressure today? No   Since previous encounter, have you referred patient for abnormal blood pressure that resulted in a new diagnosis or medication change? No   Does your patient have an abnormal blood glucose today? No   Since previous encounter, have you referred patient for abnormal blood glucose that resulted in a new diagnosis or medication change? No   Was there a life-saving intervention made? No     Client was seen in ED for bronchitis and URI. Request assistance with prescriptions.

## 2016-12-29 DIAGNOSIS — R41841 Cognitive communication deficit: Secondary | ICD-10-CM | POA: Diagnosis not present

## 2016-12-29 DIAGNOSIS — M6281 Muscle weakness (generalized): Secondary | ICD-10-CM | POA: Diagnosis not present

## 2016-12-29 DIAGNOSIS — J069 Acute upper respiratory infection, unspecified: Secondary | ICD-10-CM | POA: Diagnosis not present

## 2016-12-29 DIAGNOSIS — R2689 Other abnormalities of gait and mobility: Secondary | ICD-10-CM | POA: Diagnosis not present

## 2016-12-29 DIAGNOSIS — R54 Age-related physical debility: Secondary | ICD-10-CM | POA: Diagnosis not present

## 2016-12-31 DIAGNOSIS — M6281 Muscle weakness (generalized): Secondary | ICD-10-CM | POA: Diagnosis not present

## 2017-01-01 DIAGNOSIS — M6281 Muscle weakness (generalized): Secondary | ICD-10-CM | POA: Diagnosis not present

## 2017-01-05 DIAGNOSIS — M6281 Muscle weakness (generalized): Secondary | ICD-10-CM | POA: Diagnosis not present

## 2017-01-13 DIAGNOSIS — M6281 Muscle weakness (generalized): Secondary | ICD-10-CM | POA: Diagnosis not present

## 2017-01-24 DIAGNOSIS — R05 Cough: Secondary | ICD-10-CM | POA: Diagnosis not present

## 2017-01-24 DIAGNOSIS — I10 Essential (primary) hypertension: Secondary | ICD-10-CM | POA: Diagnosis not present

## 2017-01-24 DIAGNOSIS — F17210 Nicotine dependence, cigarettes, uncomplicated: Secondary | ICD-10-CM | POA: Diagnosis not present

## 2017-01-29 DIAGNOSIS — I498 Other specified cardiac arrhythmias: Secondary | ICD-10-CM | POA: Diagnosis not present

## 2017-01-29 DIAGNOSIS — R0602 Shortness of breath: Secondary | ICD-10-CM | POA: Diagnosis not present

## 2017-01-29 DIAGNOSIS — Z041 Encounter for examination and observation following transport accident: Secondary | ICD-10-CM | POA: Diagnosis not present

## 2017-01-29 DIAGNOSIS — Z043 Encounter for examination and observation following other accident: Secondary | ICD-10-CM | POA: Diagnosis not present

## 2017-01-30 DIAGNOSIS — R0602 Shortness of breath: Secondary | ICD-10-CM | POA: Diagnosis not present

## 2017-01-30 DIAGNOSIS — Z043 Encounter for examination and observation following other accident: Secondary | ICD-10-CM | POA: Diagnosis not present

## 2017-01-31 ENCOUNTER — Emergency Department (HOSPITAL_COMMUNITY)
Admission: EM | Admit: 2017-01-31 | Discharge: 2017-02-01 | Disposition: A | Discharge status: Home / Self Care | Payer: Medicare Other | Attending: Emergency Medicine | Admitting: Emergency Medicine

## 2017-01-31 ENCOUNTER — Encounter (HOSPITAL_COMMUNITY): Payer: Self-pay | Admitting: Emergency Medicine

## 2017-01-31 DIAGNOSIS — Y92410 Unspecified street and highway as the place of occurrence of the external cause: Secondary | ICD-10-CM | POA: Insufficient documentation

## 2017-01-31 DIAGNOSIS — Y999 Unspecified external cause status: Secondary | ICD-10-CM | POA: Diagnosis not present

## 2017-01-31 DIAGNOSIS — I10 Essential (primary) hypertension: Secondary | ICD-10-CM | POA: Insufficient documentation

## 2017-01-31 DIAGNOSIS — Z87891 Personal history of nicotine dependence: Secondary | ICD-10-CM | POA: Insufficient documentation

## 2017-01-31 DIAGNOSIS — M79671 Pain in right foot: Secondary | ICD-10-CM

## 2017-01-31 DIAGNOSIS — M79672 Pain in left foot: Secondary | ICD-10-CM

## 2017-01-31 DIAGNOSIS — Y939 Activity, unspecified: Secondary | ICD-10-CM | POA: Insufficient documentation

## 2017-01-31 DIAGNOSIS — S99921A Unspecified injury of right foot, initial encounter: Secondary | ICD-10-CM | POA: Insufficient documentation

## 2017-01-31 DIAGNOSIS — S99922A Unspecified injury of left foot, initial encounter: Secondary | ICD-10-CM | POA: Insufficient documentation
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
## 2017-02-01 ENCOUNTER — Emergency Department (HOSPITAL_COMMUNITY): Payer: Medicare Other

## 2017-02-01 DIAGNOSIS — S99921A Unspecified injury of right foot, initial encounter: Secondary | ICD-10-CM | POA: Diagnosis not present

## 2017-02-01 DIAGNOSIS — S99922A Unspecified injury of left foot, initial encounter: Secondary | ICD-10-CM | POA: Diagnosis not present

## 2017-02-01 DIAGNOSIS — M79671 Pain in right foot: Secondary | ICD-10-CM | POA: Diagnosis not present

## 2017-02-01 DIAGNOSIS — M79672 Pain in left foot: Secondary | ICD-10-CM | POA: Diagnosis not present
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
## 2017-02-01 NOTE — Discharge Instructions (Signed)
Please read and follow all provided instructions.  Your diagnoses today include:  1. Bilateral foot pain     Tests performed today include:  X-rays of the affected areas - do NOT show any broken bones  Vital signs. See below for your results today.   Medications prescribed:   None  Take any prescribed medications only as directed.  Home care instructions:   Follow any educational materials contained in this packet  Follow R.I.C.E. Protocol:  R - rest your injury   I  - use ice on injury without applying directly to skin  C - compress injury with bandage or splint  E - elevate the injury as much as possible  Follow-up instructions: Please follow-up with your primary care provider if you continue to have significant pain in 1 week. In this case you may have a more severe injury that requires further care.   Return instructions:   Please return if your toes or feet are numb or tingling, appear gray or blue, or you have severe pain (also elevate the leg and loosen splint or wrap if you were given one)  Please return to the Emergency Department if you experience worsening symptoms.   Please return if you have any other emergent concerns.  Additional Information:  Your vital signs today were: BP (!) 116/50    Pulse 85    Temp 98.1 F (36.7 C) (Oral)    Resp 18    Ht  (1.575 m)    Wt 124.3 kg    SpO2 92%    BMI 50.12 kg/m  If your blood pressure (BP) was elevated above 135/85 this visit, please have this repeated by your doctor within one month. --------------

## 2017-02-06 DIAGNOSIS — Z9981 Dependence on supplemental oxygen: Secondary | ICD-10-CM | POA: Diagnosis not present

## 2017-02-06 DIAGNOSIS — Z6841 Body Mass Index (BMI) 40.0 and over, adult: Secondary | ICD-10-CM | POA: Diagnosis not present

## 2017-02-06 DIAGNOSIS — Z87891 Personal history of nicotine dependence: Secondary | ICD-10-CM | POA: Diagnosis not present

## 2017-02-06 DIAGNOSIS — R918 Other nonspecific abnormal finding of lung field: Secondary | ICD-10-CM | POA: Diagnosis not present

## 2017-02-06 DIAGNOSIS — G8311 Monoplegia of lower limb affecting right dominant side: Secondary | ICD-10-CM | POA: Diagnosis not present

## 2017-02-06 DIAGNOSIS — Z79899 Other long term (current) drug therapy: Secondary | ICD-10-CM | POA: Diagnosis not present

## 2017-02-06 DIAGNOSIS — R05 Cough: Secondary | ICD-10-CM | POA: Diagnosis not present

## 2017-02-06 DIAGNOSIS — M6281 Muscle weakness (generalized): Secondary | ICD-10-CM | POA: Diagnosis not present

## 2017-02-06 DIAGNOSIS — M546 Pain in thoracic spine: Secondary | ICD-10-CM | POA: Diagnosis not present

## 2017-02-06 DIAGNOSIS — J984 Other disorders of lung: Secondary | ICD-10-CM | POA: Diagnosis not present

## 2017-02-06 DIAGNOSIS — Z888 Allergy status to other drugs, medicaments and biological substances status: Secondary | ICD-10-CM | POA: Diagnosis not present

## 2017-02-06 DIAGNOSIS — I10 Essential (primary) hypertension: Secondary | ICD-10-CM | POA: Diagnosis not present

## 2017-02-08 ENCOUNTER — Emergency Department (HOSPITAL_COMMUNITY)
Admission: EM | Admit: 2017-02-08 | Discharge: 2017-02-09 | Disposition: A | Discharge status: Home / Self Care | Payer: Medicare Other | Attending: Emergency Medicine | Admitting: Emergency Medicine

## 2017-02-08 DIAGNOSIS — Z659 Problem related to unspecified psychosocial circumstances: Secondary | ICD-10-CM

## 2017-02-08 DIAGNOSIS — R531 Weakness: Secondary | ICD-10-CM

## 2017-02-08 DIAGNOSIS — Z609 Problem related to social environment, unspecified: Secondary | ICD-10-CM | POA: Insufficient documentation

## 2017-02-08 DIAGNOSIS — Z87891 Personal history of nicotine dependence: Secondary | ICD-10-CM | POA: Diagnosis not present

## 2017-02-08 DIAGNOSIS — Z79899 Other long term (current) drug therapy: Secondary | ICD-10-CM | POA: Insufficient documentation

## 2017-02-08 DIAGNOSIS — I10 Essential (primary) hypertension: Secondary | ICD-10-CM | POA: Insufficient documentation

## 2017-02-08 LAB — URINALYSIS, ROUTINE W REFLEX MICROSCOPIC
BILIRUBIN URINE: NEGATIVE
Glucose, UA: NEGATIVE mg/dL
HGB URINE DIPSTICK: NEGATIVE
Ketones, ur: NEGATIVE mg/dL
Leukocytes, UA: NEGATIVE
NITRITE: NEGATIVE
PH: 5 (ref 5.0–8.0)
Protein, ur: NEGATIVE mg/dL
SPECIFIC GRAVITY, URINE: 1.017 (ref 1.005–1.030)

## 2017-02-08 LAB — CBC WITH DIFFERENTIAL/PLATELET
Basophils Absolute: 0 10*3/uL (ref 0.0–0.1)
Basophils Relative: 0 %
EOS ABS: 0.2 10*3/uL (ref 0.0–0.7)
Eosinophils Relative: 4 %
HCT: 34.1 % — ABNORMAL LOW (ref 36.0–46.0)
HEMOGLOBIN: 10.6 g/dL — AB (ref 12.0–15.0)
LYMPHS ABS: 1.5 10*3/uL (ref 0.7–4.0)
LYMPHS PCT: 30 %
MCH: 24.8 pg — AB (ref 26.0–34.0)
MCHC: 31.1 g/dL (ref 30.0–36.0)
MCV: 79.9 fL (ref 78.0–100.0)
MONOS PCT: 7 %
Monocytes Absolute: 0.4 10*3/uL (ref 0.1–1.0)
Neutro Abs: 2.9 10*3/uL (ref 1.7–7.7)
Neutrophils Relative %: 59 %
Platelets: 229 10*3/uL (ref 150–400)
RBC: 4.27 MIL/uL (ref 3.87–5.11)
RDW: 18.6 % — ABNORMAL HIGH (ref 11.5–15.5)
WBC: 5 10*3/uL (ref 4.0–10.5)

## 2017-02-08 LAB — BASIC METABOLIC PANEL
Anion gap: 7 (ref 5–15)
BUN: 17 mg/dL (ref 6–20)
CO2: 27 mmol/L (ref 22–32)
CREATININE: 0.99 mg/dL (ref 0.44–1.00)
Calcium: 8.8 mg/dL — ABNORMAL LOW (ref 8.9–10.3)
Chloride: 107 mmol/L (ref 101–111)
GFR calc Af Amer: >60 mL/min (ref >60)
GFR calc non Af Amer: 55 mL/min — ABNORMAL LOW (ref >60)
GLUCOSE: 107 mg/dL — AB (ref 65–99)
POTASSIUM: 3.9 mmol/L (ref 3.5–5.1)
SODIUM: 141 mmol/L (ref 135–145)

## 2017-02-08 NOTE — ED Notes (Signed)
Patient given new depends, patients depends noted to be saturated in urine. Patient states that she is transition from [place] to [employer].

## 2017-02-08 NOTE — ED Triage Notes (Signed)
Patient states that she is tired after pushing her motorized wheelchair. Patient with no real complaints during triage. Patient is in motorized wheelchair with lots of belongings.
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
## 2017-02-09 MED ORDER — ACETAMINOPHEN 325 MG PO TABS
650.0000 mg | ORAL_TABLET | Freq: Once | ORAL | Status: AC
  Administered 2017-02-09: 650 mg via ORAL
  Filled 2017-02-09: qty 2

## 2017-02-09 NOTE — ED Notes (Signed)
Social work at bedside.  

## 2017-02-09 NOTE — ED Notes (Signed)
Meal tray given 

## 2017-02-09 NOTE — Progress Notes (Signed)
CSW engaged with Patient at her bedside. CSW introduced self and role of CSW. CSW familiar with Patient from ED visit in November 2017. Patient is chronically homeless. CSW inquired about homelessness concerns. Patient previously stayed at the [facility] and has been followed by a Engineer, water.   Per Patient, she is already aware that she does not meet criteria for Medicare SNF coverage due to the 3 day qualifying inpatient stay. Patient also reports awareness that due to lack of Medicaid insurance, Assisted Living Facility placement would be private pay. Patient reports that she has been trying to get an extended stay hotel until she is able to get independent housing on her own. CSW inquired about family dynamics. Patient identified having several daughters and grandchildren but reports a strained relationship. Patient agreeable to contact local extended stay hotels with CSW assistance. CSW continues to follow.    Enos Fling, MSW, LCSW [facility] ED/27M Clinical Social Worker [phone]
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
## 2017-02-09 NOTE — ED Notes (Signed)
Meal tray ordered 

## 2017-02-09 NOTE — ED Provider Notes (Signed)
Assumed care of the patient from overnight Doc. Awaiting social work consult.   Patient social situation is resolved through the Child psychotherapist. Patient appears safe for discharge at this time.   Arthor Captain, PA-C 02/09/17 1648    Shaune Pollack, MD 02/11/17 [phone]

## 2017-02-14 DIAGNOSIS — M79672 Pain in left foot: Secondary | ICD-10-CM | POA: Diagnosis not present

## 2017-02-14 DIAGNOSIS — M79671 Pain in right foot: Secondary | ICD-10-CM | POA: Diagnosis not present

## 2017-02-15 DIAGNOSIS — M79672 Pain in left foot: Secondary | ICD-10-CM | POA: Diagnosis not present

## 2017-02-15 DIAGNOSIS — M79671 Pain in right foot: Secondary | ICD-10-CM | POA: Diagnosis not present

## 2017-02-25 ENCOUNTER — Emergency Department (HOSPITAL_COMMUNITY): Payer: Medicare Other

## 2017-02-25 ENCOUNTER — Encounter (HOSPITAL_COMMUNITY): Payer: Self-pay | Admitting: *Deleted

## 2017-02-25 ENCOUNTER — Emergency Department (HOSPITAL_COMMUNITY)
Admission: EM | Admit: 2017-02-25 | Discharge: 2017-02-26 | Disposition: A | Discharge status: Home / Self Care | Payer: Medicare Other | Attending: Emergency Medicine | Admitting: Emergency Medicine

## 2017-02-25 DIAGNOSIS — M79605 Pain in left leg: Secondary | ICD-10-CM | POA: Diagnosis present

## 2017-02-25 DIAGNOSIS — I10 Essential (primary) hypertension: Secondary | ICD-10-CM | POA: Diagnosis not present

## 2017-02-25 DIAGNOSIS — R0602 Shortness of breath: Secondary | ICD-10-CM | POA: Diagnosis not present

## 2017-02-25 DIAGNOSIS — Z79899 Other long term (current) drug therapy: Secondary | ICD-10-CM | POA: Insufficient documentation

## 2017-02-25 DIAGNOSIS — R600 Localized edema: Secondary | ICD-10-CM | POA: Insufficient documentation

## 2017-02-25 DIAGNOSIS — Z87891 Personal history of nicotine dependence: Secondary | ICD-10-CM | POA: Diagnosis not present

## 2017-02-25 LAB — COMPREHENSIVE METABOLIC PANEL
ALK PHOS: 84 U/L (ref 38–126)
ALT: 19 U/L (ref 14–54)
AST: 25 U/L (ref 15–41)
Albumin: 3.2 g/dL — ABNORMAL LOW (ref 3.5–5.0)
Anion gap: 5 (ref 5–15)
BILIRUBIN TOTAL: 0.3 mg/dL (ref 0.3–1.2)
BUN: 11 mg/dL (ref 6–20)
CHLORIDE: 108 mmol/L (ref 101–111)
CO2: 27 mmol/L (ref 22–32)
CREATININE: 1.04 mg/dL — AB (ref 0.44–1.00)
Calcium: 8.6 mg/dL — ABNORMAL LOW (ref 8.9–10.3)
GFR, EST AFRICAN AMERICAN: 60 mL/min — AB (ref >60)
GFR, EST NON AFRICAN AMERICAN: 52 mL/min — AB (ref >60)
Glucose, Bld: 103 mg/dL — ABNORMAL HIGH (ref 65–99)
Potassium: 4.2 mmol/L (ref 3.5–5.1)
Sodium: 140 mmol/L (ref 135–145)
Total Protein: 6.7 g/dL (ref 6.5–8.1)

## 2017-02-25 LAB — CBC WITH DIFFERENTIAL/PLATELET
BASOS PCT: 0 %
Basophils Absolute: 0 10*3/uL (ref 0.0–0.1)
EOS ABS: 0.2 10*3/uL (ref 0.0–0.7)
Eosinophils Relative: 4 %
HCT: 34.7 % — ABNORMAL LOW (ref 36.0–46.0)
HEMOGLOBIN: 11 g/dL — AB (ref 12.0–15.0)
Lymphocytes Relative: 34 %
Lymphs Abs: 1.7 10*3/uL (ref 0.7–4.0)
MCH: 25 pg — ABNORMAL LOW (ref 26.0–34.0)
MCHC: 31.7 g/dL (ref 30.0–36.0)
MCV: 78.9 fL (ref 78.0–100.0)
Monocytes Absolute: 0.4 10*3/uL (ref 0.1–1.0)
Monocytes Relative: 7 %
NEUTROS PCT: 55 %
Neutro Abs: 2.8 10*3/uL (ref 1.7–7.7)
PLATELETS: 257 10*3/uL (ref 150–400)
RBC: 4.4 MIL/uL (ref 3.87–5.11)
RDW: 17.3 % — ABNORMAL HIGH (ref 11.5–15.5)
WBC: 5.1 10*3/uL (ref 4.0–10.5)

## 2017-02-25 NOTE — ED Triage Notes (Signed)
Pt is wheelchair bound but ambulates small distances. Pt reports L ankle pain and swelling onset today.  BLE edema with redness and weeping noted. Redness extends up calf on both legs

## 2017-02-25 NOTE — ED Provider Notes (Signed)
[facility] Provider Note   CSN: [id number] Arrival date & time: 02/25/17  2027     History   Chief Complaint Chief Complaint  Patient presents with  . Leg Pain    HPI Beth Hall is a 76 y.o. female.  Patient with history of HTN, depression, presents with complaint of left leg soreness and swelling. She reports being seen and evaluated in the emergency department recently and the left was wrapped with an ACE bandage. She states she just removed the bandage and the leg is still swollen. No chest pain, fever, ulcerations, blisters. She states she is SOB frequently but may be more so now. No cough. No nausea, vomiting. No history of clots.   The history is provided by the patient. No language interpreter was used.  Leg Pain      Past Medical History:  Diagnosis Date  . Hip fracture (HCC)   . Hypertension   . Pelvic fracture (HCC)   . Post-partum depression     There are no active problems to display for this patient.   History reviewed. No pertinent surgical history.  OB History    No data available       Home Medications    Prior to Admission medications   Medication Sig Start Date End Date Taking? Authorizing Provider  ferrous sulfate 325 (65 FE) MG tablet Take 325 mg by mouth daily with breakfast.    [provider]  senna (SENOKOT) 8.6 MG tablet Take 8.6 mg by mouth every evening. 09/19/16   [provider]    Family History No family history on file.  Social History Social History  Substance Use Topics  . Smoking status: Former Smoker    Quit date: 11/09/2013  . Smokeless tobacco: Never Used  . Alcohol use No     Comment: OCCASIONAL     Allergies   Diazepam and Haldol [haloperidol lactate]   Review of Systems Review of Systems  Constitutional: Negative for chills and fever.  Respiratory: Positive for shortness of breath. Negative for cough.   Cardiovascular: Positive for leg swelling. Negative for chest pain.    Gastrointestinal: Negative.  Negative for abdominal pain and nausea.  Musculoskeletal:       See HPI.  Skin: Negative.  Negative for color change and wound.  Neurological: Negative.  Negative for weakness.     Physical Exam Updated Vital Signs BP (!) 160/118 (BP Location: Left Arm)   Pulse 63   Temp 98.4 F (36.9 C) (Oral)   Resp 20   SpO2 99%   Physical Exam  Constitutional: She is oriented to person, place, and time. She appears well-developed and well-nourished.  Neck: Normal range of motion.  Cardiovascular: Normal rate and intact distal pulses.   No murmur heard. Pulmonary/Chest: Effort normal. No respiratory distress. She has no wheezes. She has no rales.  Abdominal: Soft. She exhibits no distension.  Musculoskeletal:  LE's are equally swollen bilaterally. No redness or warmth. Minimally tender. No calf tenderness. No bony deformities.  Neurological: She is alert and oriented to person, place, and time.  Skin: Skin is warm and dry.     ED Treatments / Results  Labs (all labs ordered are listed, but only abnormal results are displayed) Labs Reviewed  COMPREHENSIVE METABOLIC PANEL - Abnormal; Notable for the following:       Result Value   Glucose, Bld 103 (*)    Creatinine, Ser 1.04 (*)    Calcium 8.6 (*)  Albumin 3.2 (*)    GFR calc non Af Amer 52 (*)    GFR calc Af Amer 60 (*)    All other components within normal limits  CBC WITH DIFFERENTIAL/PLATELET - Abnormal; Notable for the following:    Hemoglobin 11.0 (*)    HCT 34.7 (*)    MCH 25.0 (*)    RDW 17.3 (*)    All other components within normal limits    EKG  EKG Interpretation None       Radiology No results found.  Procedures Procedures (including critical care time)  Medications Ordered in ED Medications - No data to display   Initial Impression / Assessment and Plan / ED Course  I have reviewed the triage vital signs and the nursing notes.  Pertinent labs & imaging results that  were available during my care of the patient were reviewed by me and considered in my medical decision making (see chart for details).     The patient presents with complaint of left LE swelling and pain. She reports she removed the bandage applied last time she was here and symptoms continue so she returned for another bandage.   She complains of frequent SOB without cough or fever. Will check CXR to r/o congestion.   She perseverates on lost pregnancy years ago that involved HTN of pregnancy and that she only needs to take her blood pressure medication at certain times of the month secondary to hormonal changes that continue as a result of that failed pregnancy. She does not endorse SI or HI. She is oriented and appropriate in behavior. Do not feel psychiatric evaluation is required at this time.   11:40 - Blood pressure is improved on manual pressure check. When asked what she takes for HTN and whether she has any at home to take the patient states "I threw it away last week". Will encourage follow up with PCP to address this issue and to consider Lasix given mild edema on CXR and LE edema. No hypoxia, chest pain.   CXR shows mild edema vs bronchitic changes. No antibiotics necessary. Will prescribe TED hose, given ACE wrap per patient's request.   Final Clinical Impressions(s) / ED Diagnoses   Final diagnoses:  None   1. Bilateral LE edema 2. Hypertension  New Prescriptions New Prescriptions   No medications on file     Elpidio Anis, Cordelia Poche 02/25/17 2342    Maia Plan, MD 02/26/17 1110

## 2017-03-03 ENCOUNTER — Encounter (HOSPITAL_COMMUNITY): Payer: Self-pay | Admitting: Emergency Medicine

## 2017-03-03 DIAGNOSIS — R600 Localized edema: Secondary | ICD-10-CM | POA: Diagnosis not present

## 2017-03-03 DIAGNOSIS — Z79899 Other long term (current) drug therapy: Secondary | ICD-10-CM | POA: Diagnosis not present

## 2017-03-03 DIAGNOSIS — D649 Anemia, unspecified: Secondary | ICD-10-CM | POA: Diagnosis not present

## 2017-03-03 DIAGNOSIS — I10 Essential (primary) hypertension: Secondary | ICD-10-CM | POA: Diagnosis not present

## 2017-03-03 DIAGNOSIS — Z87891 Personal history of nicotine dependence: Secondary | ICD-10-CM | POA: Diagnosis not present

## 2017-03-03 DIAGNOSIS — J209 Acute bronchitis, unspecified: Secondary | ICD-10-CM | POA: Insufficient documentation

## 2017-03-03 DIAGNOSIS — R0602 Shortness of breath: Secondary | ICD-10-CM | POA: Diagnosis present

## 2017-03-03 LAB — CBC
HEMATOCRIT: 35.5 % — AB (ref 36.0–46.0)
HEMOGLOBIN: 11.1 g/dL — AB (ref 12.0–15.0)
MCH: 25.1 pg — ABNORMAL LOW (ref 26.0–34.0)
MCHC: 31.3 g/dL (ref 30.0–36.0)
MCV: 80.1 fL (ref 78.0–100.0)
Platelets: 239 10*3/uL (ref 150–400)
RBC: 4.43 MIL/uL (ref 3.87–5.11)
RDW: 17.6 % — AB (ref 11.5–15.5)
WBC: 5.1 10*3/uL (ref 4.0–10.5)

## 2017-03-03 LAB — I-STAT TROPONIN, ED: Troponin i, poc: 0 ng/mL (ref 0.00–0.08)

## 2017-03-03 NOTE — ED Triage Notes (Signed)
Pt arrives with increasing SOB with productive cough; pt states recent dx of bronchitis; states her cough syrup spilled in her bag

## 2017-03-04 ENCOUNTER — Emergency Department (HOSPITAL_COMMUNITY)
Admission: EM | Admit: 2017-03-04 | Discharge: 2017-03-04 | Disposition: A | Discharge status: Home / Self Care | Payer: Medicare Other | Attending: Emergency Medicine | Admitting: Emergency Medicine

## 2017-03-04 ENCOUNTER — Emergency Department (HOSPITAL_COMMUNITY): Payer: Medicare Other

## 2017-03-04 DIAGNOSIS — J209 Acute bronchitis, unspecified: Secondary | ICD-10-CM

## 2017-03-04 DIAGNOSIS — R609 Edema, unspecified: Secondary | ICD-10-CM

## 2017-03-04 DIAGNOSIS — R0602 Shortness of breath: Secondary | ICD-10-CM | POA: Diagnosis not present

## 2017-03-04 DIAGNOSIS — D649 Anemia, unspecified: Secondary | ICD-10-CM

## 2017-03-04 LAB — BASIC METABOLIC PANEL
ANION GAP: 5 (ref 5–15)
BUN: 12 mg/dL (ref 6–20)
CHLORIDE: 108 mmol/L (ref 101–111)
CO2: 28 mmol/L (ref 22–32)
CREATININE: 1.18 mg/dL — AB (ref 0.44–1.00)
Calcium: 8.5 mg/dL — ABNORMAL LOW (ref 8.9–10.3)
GFR calc non Af Amer: 44 mL/min — ABNORMAL LOW (ref >60)
GFR, EST AFRICAN AMERICAN: 51 mL/min — AB (ref >60)
GLUCOSE: 112 mg/dL — AB (ref 65–99)
Potassium: 3.8 mmol/L (ref 3.5–5.1)
Sodium: 141 mmol/L (ref 135–145)

## 2017-03-04 MED ORDER — IPRATROPIUM-ALBUTEROL 0.5-2.5 (3) MG/3ML IN SOLN
3.0000 mL | Freq: Once | RESPIRATORY_TRACT | Status: AC
  Administered 2017-03-04: 3 mL via RESPIRATORY_TRACT
  Filled 2017-03-04: qty 3

## 2017-03-04 MED ORDER — BUMETANIDE 0.5 MG PO TABS: 0.5000 mg | ORAL_TABLET | Freq: Every day | ORAL | 0 refills | Status: DC

## 2017-03-04 MED ORDER — ALBUTEROL SULFATE HFA 108 (90 BASE) MCG/ACT IN AERS: 2.0000 | INHALATION_SPRAY | RESPIRATORY_TRACT | 0 refills | Status: DC | PRN

## 2017-03-04 MED ORDER — PREDNISONE 20 MG PO TABS
60.0000 mg | ORAL_TABLET | Freq: Once | ORAL | Status: AC
  Administered 2017-03-04: 60 mg via ORAL
  Filled 2017-03-04: qty 3

## 2017-03-04 MED ORDER — PREDNISONE 50 MG PO TABS: 50.0000 mg | ORAL_TABLET | Freq: Every day | ORAL | 0 refills | Status: DC

## 2017-03-04 NOTE — ED Provider Notes (Signed)
[facility] Provider Note   CSN: [id number] Arrival date & time: 03/03/17  2316   By signing my name below, I, Beth Hall, attest that this documentation has been prepared under the direction and in the presence of Beth Booze, MD. Electronically Signed: Karren Hall, ED Scribe. 03/04/17. 2:30 AM.  History   Chief Complaint Chief Complaint  Patient presents with  . Shortness of Breath  . Cough    The history is provided by the patient. No language interpreter was used.    HPI Comments: Beth Hall is a 76 y.o. female with a PMHx of HTN, who presents to the Emergency Department complaining of gradually worsening shortness of breathe that started today. Her associated symptoms include wheezing, cough, runny nose, SOB, chills, nausea, sweating. She also notes having green stool. Treatment using cough syrup was tried PTA with no relief. She notes she has an inhaler but denies using it PTA. Pt also states she was hospitalized previous in March with similar associated symptoms and was diagnosed with Bronchitis. No other treatment tried PTA. No alleviating factors. She denies fever. No other acute associated symptoms at this time.    Past Medical History:  Diagnosis Date  . Hip fracture (HCC)   . Hypertension   . Pelvic fracture (HCC)   . Post-partum depression     There are no active problems to display for this patient.   History reviewed. No pertinent surgical history.  OB History    No data available       Home Medications    Prior to Admission medications   Medication Sig Start Date End Date Taking? Authorizing Provider  ferrous sulfate 325 (65 FE) MG tablet Take 325 mg by mouth daily with breakfast.    [provider]  senna (SENOKOT) 8.6 MG tablet Take 8.6 mg by mouth every evening. 09/19/16   [provider]    Family History History reviewed. No pertinent family history.  Social History Social History  Substance Use Topics  .  Smoking status: Former Smoker    Quit date: 11/09/2013  . Smokeless tobacco: Never Used  . Alcohol use No     Comment: OCCASIONAL     Allergies   Diazepam and Haldol [haloperidol lactate]   Review of Systems Review of Systems  Constitutional: Positive for chills and diaphoresis. Negative for fever.  HENT: Positive for postnasal drip.   Respiratory: Positive for cough, shortness of breath and wheezing.   Gastrointestinal: Positive for nausea.  All other systems reviewed and are negative.    Physical Exam Updated Vital Signs BP 131/76   Pulse (!) 58   Temp 97.8 F (36.6 C) (Oral)   Resp 16   SpO2 100%   Physical Exam  Constitutional: She is oriented to person, place, and time. She appears well-developed and well-nourished.  Morbidly obese.   HENT:  Head: Normocephalic and atraumatic.  Eyes: EOM are normal. Pupils are equal, round, and reactive to light.  Neck: Normal range of motion. Neck supple. No JVD present.  Cardiovascular: Normal rate, regular rhythm and normal heart sounds.   No murmur heard. Pulmonary/Chest: Effort normal. She has wheezes. She has no rales. She exhibits no tenderness.  Mild diffuse wheezing.   Abdominal: Soft. Bowel sounds are normal. She exhibits no distension and no mass. There is no tenderness.  Musculoskeletal: Normal range of motion. She exhibits no edema.  3+ pretibial and pedal edema.   Lymphadenopathy:    She has no cervical adenopathy.  Neurological: She is alert and oriented to person, place, and time. No cranial nerve deficit. She exhibits normal muscle tone. Coordination normal.  Skin: Skin is warm and dry. No rash noted.  Psychiatric: She has a normal mood and affect. Her behavior is normal. Judgment and thought content normal.  Nursing note and vitals reviewed.  ED Treatments / Results  DIAGNOSTIC STUDIES: Oxygen Saturation is 100% on RA, normal by my interpretation.   COORDINATION OF CARE: 2:21 AM-Discussed next steps  with pt. Pt verbalized understanding and is agreeable with the plan.   Labs (all labs ordered are listed, but only abnormal results are displayed) Labs Reviewed  BASIC METABOLIC PANEL - Abnormal; Notable for the following:       Result Value   Glucose, Bld 112 (*)    Creatinine, Ser 1.18 (*)    Calcium 8.5 (*)    GFR calc non Af Amer 44 (*)    GFR calc Af Amer 51 (*)    All other components within normal limits  CBC - Abnormal; Notable for the following:    Hemoglobin 11.1 (*)    HCT 35.5 (*)    MCH 25.1 (*)    RDW 17.6 (*)    All other components within normal limits  I-STAT TROPOININ, ED    EKG  EKG Interpretation  Date/Time:  Tuesday Mar 03 2017 23:31:56 EDT Ventricular Rate:  52 PR Interval:  140 QRS Duration: 76 QT Interval:  454 QTC Calculation: 422 R Axis:   68 Text Interpretation:  Sinus bradycardia ST & T wave abnormality, consider inferior ischemia Abnormal ECG No old tracing to compare Confirmed by Beth Hall ([id number]) on 03/03/2017 11:59:12 PM       Radiology Dg Chest 2 View  Result Date: 03/04/2017 CLINICAL DATA:  Shortness of breath EXAM: CHEST  2 VIEW COMPARISON:  02/25/2017 FINDINGS: Mild cardiomegaly with central vascular congestion. No focal consolidation. No pleural effusion. Aortic atherosclerosis. No pneumothorax. Degenerative changes of the spine. IMPRESSION: Cardiomegaly with central vascular congestion. Electronically Signed   By: Jasmine Pang M.D.   On: 03/04/2017 00:11    Procedures Procedures (including critical care time)  Medications Ordered in ED Medications  ipratropium-albuterol (DUONEB) 0.5-2.5 (3) MG/3ML nebulizer solution 3 mL (3 mLs Nebulization Given 03/04/17 0237)  predniSONE (DELTASONE) tablet 60 mg (60 mg Oral Given 03/04/17 0237)  ipratropium-albuterol (DUONEB) 0.5-2.5 (3) MG/3ML nebulizer solution 3 mL (3 mLs Nebulization Given 03/04/17 0323)     Initial Impression / Assessment and Plan / ED Course  I have reviewed the  triage vital signs and the nursing notes.  Pertinent labs & imaging results that were available during my care of the patient were reviewed by me and considered in my medical decision making (see chart for details).  Cough with wheezing consistent with acute bronchitis. Chest x-ray shows no evidence of pneumonia. WBC is 5.1. No indication for antibiotics. She is given a dose of prednisone and given albuterol with ipratropium via nebulizer with modest improvement. She is given a second nebulizer treatment following which lungs are completely clear. Of note, she apparently had a reaction to furosemide. She clearly needs to be on a diuretic. She's given a prescription for bumetanide. She is also given prescriptions for prednisone and albuterol inhaler. She is to follow-up with her physicians from the [facility]. Return precautions discussed.  Final Clinical Impressions(s) / ED Diagnoses   Final diagnoses:  None    New Prescriptions New Prescriptions   ALBUTEROL (PROVENTIL HFA;VENTOLIN HFA)  108 (90 BASE) MCG/ACT INHALER    Inhale 2 puffs into the lungs every 4 (four) hours as needed for wheezing or shortness of breath.   BUMETANIDE (BUMEX) 0.5 MG TABLET    Take 1 tablet (0.5 mg total) by mouth daily.   PREDNISONE (DELTASONE) 50 MG TABLET    Take 1 tablet (50 mg total) by mouth daily.   I personally performed the services described in this documentation, which was scribed in my presence. The recorded information has been reviewed and is accurate.       Beth BoozeGlick, Nikkie Liming, MD 03/04/17 [phone]

## 2017-03-04 NOTE — Discharge Instructions (Signed)
Stay on a low salt diet. ° °Return if symptoms are getting worse. °

## 2017-03-11 DIAGNOSIS — R0981 Nasal congestion: Secondary | ICD-10-CM | POA: Diagnosis not present

## 2017-03-11 DIAGNOSIS — R05 Cough: Secondary | ICD-10-CM | POA: Diagnosis not present

## 2017-03-11 DIAGNOSIS — J069 Acute upper respiratory infection, unspecified: Secondary | ICD-10-CM | POA: Diagnosis not present

## 2017-03-12 DIAGNOSIS — J069 Acute upper respiratory infection, unspecified: Secondary | ICD-10-CM | POA: Diagnosis not present

## 2017-03-16 DIAGNOSIS — I10 Essential (primary) hypertension: Secondary | ICD-10-CM | POA: Diagnosis not present

## 2017-03-16 DIAGNOSIS — R05 Cough: Secondary | ICD-10-CM | POA: Diagnosis not present

## 2017-03-16 DIAGNOSIS — R001 Bradycardia, unspecified: Secondary | ICD-10-CM | POA: Diagnosis not present

## 2017-03-16 DIAGNOSIS — F4310 Post-traumatic stress disorder, unspecified: Secondary | ICD-10-CM | POA: Diagnosis not present

## 2017-03-16 DIAGNOSIS — Z888 Allergy status to other drugs, medicaments and biological substances status: Secondary | ICD-10-CM | POA: Diagnosis not present

## 2017-03-16 DIAGNOSIS — R0602 Shortness of breath: Secondary | ICD-10-CM | POA: Diagnosis not present

## 2017-03-16 DIAGNOSIS — G8311 Monoplegia of lower limb affecting right dominant side: Secondary | ICD-10-CM | POA: Diagnosis not present

## 2017-03-16 DIAGNOSIS — J449 Chronic obstructive pulmonary disease, unspecified: Secondary | ICD-10-CM | POA: Diagnosis not present

## 2017-03-16 DIAGNOSIS — M1250 Traumatic arthropathy, unspecified site: Secondary | ICD-10-CM | POA: Diagnosis not present

## 2017-03-16 DIAGNOSIS — Z87891 Personal history of nicotine dependence: Secondary | ICD-10-CM | POA: Diagnosis not present

## 2017-03-16 DIAGNOSIS — Z9981 Dependence on supplemental oxygen: Secondary | ICD-10-CM | POA: Diagnosis not present

## 2017-03-16 DIAGNOSIS — Z79899 Other long term (current) drug therapy: Secondary | ICD-10-CM | POA: Diagnosis not present

## 2017-03-25 DIAGNOSIS — X58XXXA Exposure to other specified factors, initial encounter: Secondary | ICD-10-CM | POA: Diagnosis not present

## 2017-03-25 DIAGNOSIS — S80821A Blister (nonthermal), right lower leg, initial encounter: Secondary | ICD-10-CM | POA: Diagnosis not present

## 2017-03-25 DIAGNOSIS — Y999 Unspecified external cause status: Secondary | ICD-10-CM | POA: Diagnosis not present

## 2017-03-28 ENCOUNTER — Encounter (HOSPITAL_COMMUNITY): Payer: Self-pay | Admitting: Emergency Medicine

## 2017-03-28 DIAGNOSIS — I10 Essential (primary) hypertension: Secondary | ICD-10-CM | POA: Insufficient documentation

## 2017-03-28 DIAGNOSIS — J069 Acute upper respiratory infection, unspecified: Secondary | ICD-10-CM | POA: Insufficient documentation

## 2017-03-28 DIAGNOSIS — Z87891 Personal history of nicotine dependence: Secondary | ICD-10-CM | POA: Diagnosis not present

## 2017-03-28 DIAGNOSIS — R05 Cough: Secondary | ICD-10-CM | POA: Diagnosis not present

## 2017-03-28 DIAGNOSIS — F419 Anxiety disorder, unspecified: Secondary | ICD-10-CM | POA: Diagnosis not present

## 2017-03-28 DIAGNOSIS — Z79899 Other long term (current) drug therapy: Secondary | ICD-10-CM | POA: Diagnosis not present

## 2017-03-28 DIAGNOSIS — R0602 Shortness of breath: Secondary | ICD-10-CM | POA: Diagnosis not present

## 2017-03-28 LAB — CBC WITH DIFFERENTIAL/PLATELET
BASOS PCT: 0 %
Basophils Absolute: 0 10*3/uL (ref 0.0–0.1)
EOS ABS: 0.3 10*3/uL (ref 0.0–0.7)
Eosinophils Relative: 5 %
HEMATOCRIT: 38.4 % (ref 36.0–46.0)
HEMOGLOBIN: 11.7 g/dL — AB (ref 12.0–15.0)
LYMPHS ABS: 1.7 10*3/uL (ref 0.7–4.0)
Lymphocytes Relative: 31 %
MCH: 25 pg — AB (ref 26.0–34.0)
MCHC: 30.5 g/dL (ref 30.0–36.0)
MCV: 82.1 fL (ref 78.0–100.0)
MONO ABS: 0.3 10*3/uL (ref 0.1–1.0)
MONOS PCT: 5 %
Neutro Abs: 3.2 10*3/uL (ref 1.7–7.7)
Neutrophils Relative %: 59 %
Platelets: 290 10*3/uL (ref 150–400)
RBC: 4.68 MIL/uL (ref 3.87–5.11)
RDW: 17.4 % — AB (ref 11.5–15.5)
WBC: 5.5 10*3/uL (ref 4.0–10.5)

## 2017-03-28 LAB — BASIC METABOLIC PANEL
Anion gap: 8 (ref 5–15)
BUN: 14 mg/dL (ref 6–20)
CALCIUM: 8.8 mg/dL — AB (ref 8.9–10.3)
CHLORIDE: 104 mmol/L (ref 101–111)
CO2: 27 mmol/L (ref 22–32)
CREATININE: 1.19 mg/dL — AB (ref 0.44–1.00)
GFR calc non Af Amer: 44 mL/min — ABNORMAL LOW (ref >60)
GFR, EST AFRICAN AMERICAN: 51 mL/min — AB (ref >60)
Glucose, Bld: 96 mg/dL (ref 65–99)
Potassium: 3.9 mmol/L (ref 3.5–5.1)
SODIUM: 139 mmol/L (ref 135–145)

## 2017-03-28 NOTE — ED Triage Notes (Signed)
Pt states that she is having a persistent cough that is not getting relief with no medications ordered a week ago when she was seen on the ED. Denies any pain or discomfort. Pt states she is having a lot of anxiety.

## 2017-03-28 NOTE — ED Notes (Signed)
Power wheelchair out of power.  Placed patient in manual wheelchair and taken to triage.  Power chair placed near pediatric entrance to charge with patient label placed on it.  Registration staff aware.

## 2017-03-29 ENCOUNTER — Emergency Department (HOSPITAL_COMMUNITY): Payer: Medicare Other

## 2017-03-29 ENCOUNTER — Emergency Department (HOSPITAL_COMMUNITY)
Admission: EM | Admit: 2017-03-29 | Discharge: 2017-03-29 | Disposition: A | Discharge status: Home / Self Care | Payer: Medicare Other | Attending: Emergency Medicine | Admitting: Emergency Medicine

## 2017-03-29 DIAGNOSIS — J069 Acute upper respiratory infection, unspecified: Secondary | ICD-10-CM

## 2017-03-29 DIAGNOSIS — R0602 Shortness of breath: Secondary | ICD-10-CM | POA: Diagnosis not present

## 2017-03-29 DIAGNOSIS — R05 Cough: Secondary | ICD-10-CM | POA: Diagnosis not present

## 2017-03-29 DIAGNOSIS — F419 Anxiety disorder, unspecified: Secondary | ICD-10-CM

## 2017-03-29 MED ORDER — ALBUTEROL SULFATE (2.5 MG/3ML) 0.083% IN NEBU
5.0000 mg | INHALATION_SOLUTION | Freq: Once | RESPIRATORY_TRACT | Status: AC
  Administered 2017-03-29: 5 mg via RESPIRATORY_TRACT
  Filled 2017-03-29: qty 6

## 2017-03-29 NOTE — ED Notes (Signed)
Pt speaking with Social work on phone.

## 2017-03-29 NOTE — ED Notes (Signed)
Pt states she understands instructions. Discharged stable via personalized motorized chair.

## 2017-03-29 NOTE — ED Notes (Signed)
Joselyn Glassmanyler PA informed of SW by phone and given number for same.

## 2017-03-29 NOTE — ED Notes (Signed)
Patient transported to X-ray 

## 2017-03-29 NOTE — ED Notes (Signed)
Patient returned from X-ray 
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
## 2017-03-29 NOTE — ED Notes (Signed)
Pt offered phone to make housing arrangements. Phone declinede.

## 2017-03-29 NOTE — ED Notes (Signed)
Pt c/o pain at buttocks after sleeping on back. Pt repositioned and blocked in place with pillow.

## 2017-03-29 NOTE — ED Notes (Signed)
Pt s;eeping with intermittently snoring respirations., Resp even and non labored.

## 2017-03-29 NOTE — Progress Notes (Signed)
CSW spoke with patient on the phone about housing needs and resources. Patient reported that she was recently unable to get an apartment and has been staying at a motel. Patient reported that she is no longer able to stay at the motel due to finances. CSW provided patient with shelter information. CSW inquired if patient had any additional needs, patient reported none. CSW signing off, please consult if new needs arise.   Celso SickleKimberly Arlie Riker, LCSWA [facility]  Clinical Social Worker [phone]

## 2017-03-29 NOTE — ED Notes (Signed)
Pt sleeping with snoring respiration. 

## 2017-03-29 NOTE — Discharge Instructions (Signed)
Please read and follow all provided instructions.  Your diagnoses today include:  1. Upper respiratory tract infection, unspecified type   2. Anxiety     Tests performed today include: Vital signs. See below for your results today.   Medications prescribed:  Take as prescribed   Home care instructions:  Follow any educational materials contained in this packet.  Follow-up instructions: Please follow-up with your primary care provider for further evaluation of symptoms and treatment   Return instructions:  Please return to the Emergency Department if you do not get better, if you get worse, or new symptoms OR  - Fever (temperature greater than 101.56F)  - Bleeding that does not stop with holding pressure to the area    -Severe pain (please note that you may be more sore the day after your accident)  - Chest Pain  - Difficulty breathing  - Severe nausea or vomiting  - Inability to tolerate food and liquids  - Passing out  - Skin becoming red around your wounds  - Change in mental status (confusion or lethargy)  - New numbness or weakness    Please return if you have any other emergent concerns.  Additional Information:  Your vital signs today were: BP (!) 139/52    Pulse (!) 52    Temp 98.3 F (36.8 C) (Oral)    Resp 17    Ht 5\' 5"  (1.651 m)    Wt 124.3 kg (274 lb)    SpO2 99%    BMI 45.60 kg/m  If your blood pressure (BP) was elevated above 135/85 this visit, please have this repeated by your doctor within one month. ---------------

## 2017-04-14 DIAGNOSIS — Z885 Allergy status to narcotic agent status: Secondary | ICD-10-CM | POA: Diagnosis not present

## 2017-04-14 DIAGNOSIS — T148XXA Other injury of unspecified body region, initial encounter: Secondary | ICD-10-CM | POA: Diagnosis not present

## 2017-04-14 DIAGNOSIS — S8012XA Contusion of left lower leg, initial encounter: Secondary | ICD-10-CM | POA: Diagnosis not present

## 2017-04-14 DIAGNOSIS — Z87891 Personal history of nicotine dependence: Secondary | ICD-10-CM | POA: Diagnosis not present

## 2017-04-14 DIAGNOSIS — S8011XA Contusion of right lower leg, initial encounter: Secondary | ICD-10-CM | POA: Diagnosis not present

## 2017-04-14 DIAGNOSIS — W19XXXA Unspecified fall, initial encounter: Secondary | ICD-10-CM | POA: Diagnosis not present

## 2017-04-14 DIAGNOSIS — I10 Essential (primary) hypertension: Secondary | ICD-10-CM | POA: Diagnosis not present

## 2017-04-14 DIAGNOSIS — S8991XA Unspecified injury of right lower leg, initial encounter: Secondary | ICD-10-CM | POA: Diagnosis not present

## 2017-04-14 DIAGNOSIS — Z79899 Other long term (current) drug therapy: Secondary | ICD-10-CM | POA: Diagnosis not present

## 2017-04-14 DIAGNOSIS — S8992XA Unspecified injury of left lower leg, initial encounter: Secondary | ICD-10-CM | POA: Diagnosis not present

## 2017-04-14 DIAGNOSIS — Z888 Allergy status to other drugs, medicaments and biological substances status: Secondary | ICD-10-CM | POA: Diagnosis not present

## 2017-04-17 ENCOUNTER — Encounter (HOSPITAL_COMMUNITY): Payer: Self-pay | Admitting: Emergency Medicine

## 2017-04-17 ENCOUNTER — Emergency Department (HOSPITAL_COMMUNITY)
Admission: EM | Admit: 2017-04-17 | Discharge: 2017-04-17 | Disposition: A | Discharge status: Home / Self Care | Payer: Medicare Other | Attending: Emergency Medicine | Admitting: Emergency Medicine

## 2017-04-17 ENCOUNTER — Emergency Department (HOSPITAL_COMMUNITY): Payer: Medicare Other

## 2017-04-17 DIAGNOSIS — Z79899 Other long term (current) drug therapy: Secondary | ICD-10-CM | POA: Insufficient documentation

## 2017-04-17 DIAGNOSIS — R072 Precordial pain: Secondary | ICD-10-CM | POA: Insufficient documentation

## 2017-04-17 DIAGNOSIS — Z87891 Personal history of nicotine dependence: Secondary | ICD-10-CM | POA: Insufficient documentation

## 2017-04-17 DIAGNOSIS — R0789 Other chest pain: Secondary | ICD-10-CM | POA: Diagnosis present

## 2017-04-17 DIAGNOSIS — I10 Essential (primary) hypertension: Secondary | ICD-10-CM | POA: Diagnosis not present

## 2017-04-17 DIAGNOSIS — R079 Chest pain, unspecified: Secondary | ICD-10-CM | POA: Diagnosis not present

## 2017-04-17 HISTORY — DX: Morbid (severe) obesity due to excess calories: E66.01

## 2017-04-17 HISTORY — DX: Homelessness: Z59.0

## 2017-04-17 HISTORY — DX: Homelessness unspecified: Z59.00

## 2017-04-17 LAB — BASIC METABOLIC PANEL
ANION GAP: 5 (ref 5–15)
BUN: 18 mg/dL (ref 6–20)
CALCIUM: 8.2 mg/dL — AB (ref 8.9–10.3)
CHLORIDE: 108 mmol/L (ref 101–111)
CO2: 27 mmol/L (ref 22–32)
Creatinine, Ser: 1.12 mg/dL — ABNORMAL HIGH (ref 0.44–1.00)
GFR calc non Af Amer: 47 mL/min — ABNORMAL LOW (ref >60)
GFR, EST AFRICAN AMERICAN: 55 mL/min — AB (ref >60)
Glucose, Bld: 109 mg/dL — ABNORMAL HIGH (ref 65–99)
POTASSIUM: 4.1 mmol/L (ref 3.5–5.1)
Sodium: 140 mmol/L (ref 135–145)

## 2017-04-17 LAB — CBC
HCT: 35.5 % — ABNORMAL LOW (ref 36.0–46.0)
HEMOGLOBIN: 10.8 g/dL — AB (ref 12.0–15.0)
MCH: 24.6 pg — AB (ref 26.0–34.0)
MCHC: 30.4 g/dL (ref 30.0–36.0)
MCV: 80.9 fL (ref 78.0–100.0)
Platelets: 272 10*3/uL (ref 150–400)
RBC: 4.39 MIL/uL (ref 3.87–5.11)
RDW: 16.4 % — ABNORMAL HIGH (ref 11.5–15.5)
WBC: 5.2 10*3/uL (ref 4.0–10.5)

## 2017-04-17 LAB — I-STAT TROPONIN, ED: TROPONIN I, POC: 0 ng/mL (ref 0.00–0.08)

## 2017-04-17 LAB — D-DIMER, QUANTITATIVE: D-Dimer, Quant: 0.76 ug/mL-FEU — ABNORMAL HIGH (ref 0.00–0.50)

## 2017-04-17 MED ORDER — IOPAMIDOL (ISOVUE-370) INJECTION 76%
INTRAVENOUS | Status: AC
  Filled 2017-04-17: qty 100

## 2017-04-17 NOTE — ED Triage Notes (Signed)
[facility] reports intermittent central chest pain onset this morning , denies SOB , no nausea or diaphoresis . Pt. received ASA 324 mg and NTG sl x1 with relief . Denies chest pain at arrival .

## 2017-04-17 NOTE — ED Notes (Signed)
Pt refused to go to CT scan , MD talking with pt now

## 2017-04-17 NOTE — Discharge Instructions (Signed)

## 2017-04-17 NOTE — ED Provider Notes (Signed)
Pt refuses to have CT chest She reports she has had them previously but doesn't want another Although she is stable, no distress, there is a chance she has PE given her history She refuses and insists on being discharged Advised patient she can return at anytime for re-evaluation Pt is awake/alert, no distress, she is able to make her own decisions Pt never seen by Dr Rea CollegeLittle   Beth Nelligan, MD 04/17/17 [phone]
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
## 2017-04-17 NOTE — Progress Notes (Signed)
CSW provided pt with Taxi voucher via [employer]. Pt is to arrive at [employer] to get scooter. Pt expressed no further concerns at the time of discharge.     Claude MangesKierra S. Adina Puzzo, MSW, LCSW-A Emergency Department Clinical Social Worker [phone]

## 2017-04-19 DIAGNOSIS — M7652 Patellar tendinitis, left knee: Secondary | ICD-10-CM | POA: Diagnosis not present

## 2017-04-19 DIAGNOSIS — M79662 Pain in left lower leg: Secondary | ICD-10-CM | POA: Diagnosis not present

## 2017-04-19 DIAGNOSIS — M79605 Pain in left leg: Secondary | ICD-10-CM | POA: Diagnosis not present

## 2017-04-19 DIAGNOSIS — R531 Weakness: Secondary | ICD-10-CM | POA: Diagnosis not present

## 2017-04-19 DIAGNOSIS — M1712 Unilateral primary osteoarthritis, left knee: Secondary | ICD-10-CM | POA: Diagnosis not present

## 2017-04-20 DIAGNOSIS — M79605 Pain in left leg: Secondary | ICD-10-CM | POA: Diagnosis not present

## 2017-04-20 DIAGNOSIS — M7652 Patellar tendinitis, left knee: Secondary | ICD-10-CM | POA: Diagnosis not present

## 2017-04-20 DIAGNOSIS — M1712 Unilateral primary osteoarthritis, left knee: Secondary | ICD-10-CM | POA: Diagnosis not present

## 2017-04-30 DIAGNOSIS — Z79899 Other long term (current) drug therapy: Secondary | ICD-10-CM | POA: Insufficient documentation

## 2017-04-30 DIAGNOSIS — Z590 Homelessness: Secondary | ICD-10-CM | POA: Diagnosis not present

## 2017-04-30 DIAGNOSIS — L259 Unspecified contact dermatitis, unspecified cause: Secondary | ICD-10-CM | POA: Insufficient documentation

## 2017-04-30 DIAGNOSIS — L309 Dermatitis, unspecified: Secondary | ICD-10-CM | POA: Diagnosis not present

## 2017-04-30 DIAGNOSIS — Z87891 Personal history of nicotine dependence: Secondary | ICD-10-CM | POA: Diagnosis not present

## 2017-04-30 DIAGNOSIS — R21 Rash and other nonspecific skin eruption: Secondary | ICD-10-CM | POA: Diagnosis present

## 2017-04-30 DIAGNOSIS — I10 Essential (primary) hypertension: Secondary | ICD-10-CM | POA: Diagnosis not present

## 2017-05-01 ENCOUNTER — Emergency Department (HOSPITAL_COMMUNITY)
Admission: EM | Admit: 2017-05-01 | Discharge: 2017-05-01 | Disposition: A | Discharge status: Home / Self Care | Payer: Medicare Other | Attending: Emergency Medicine | Admitting: Emergency Medicine

## 2017-05-01 ENCOUNTER — Encounter (HOSPITAL_COMMUNITY): Payer: Self-pay | Admitting: Emergency Medicine

## 2017-05-01 DIAGNOSIS — L309 Dermatitis, unspecified: Secondary | ICD-10-CM

## 2017-05-01 MED ORDER — DIPHENHYDRAMINE HCL 25 MG PO CAPS
25.0000 mg | ORAL_CAPSULE | Freq: Once | ORAL | Status: AC
  Administered 2017-05-01: 25 mg via ORAL
  Filled 2017-05-01: qty 1

## 2017-05-01 MED ORDER — TRIAMCINOLONE ACETONIDE 0.1 % EX CREA: 1.0000 "application " | TOPICAL_CREAM | Freq: Four times a day (QID) | CUTANEOUS | 0 refills | Status: DC | PRN

## 2017-05-01 MED ORDER — DIPHENHYDRAMINE HCL 25 MG PO CAPS: 25.0000 mg | ORAL_CAPSULE | Freq: Four times a day (QID) | ORAL | 0 refills | Status: DC | PRN

## 2017-05-01 NOTE — ED Provider Notes (Signed)
[facility] Provider Note   CSN: [id number] Arrival date & time: 04/30/17  2349  By signing my name below, I, Rosana Fret, attest that this documentation has been prepared under the direction and in the presence of Pricilla Loveless, MD. Electronically Signed: Rosana Fret, ED Scribe. 05/01/17. 1:46 AM.  History   Chief Complaint Chief Complaint  Patient presents with  . Itchy Rashes - Feet   The history is provided by the patient. No language interpreter was used.   HPI Comments: Beth Hall is a 76 y.o. female who presents to the Emergency Department complaining of constant, moderate itching and redness to her bilateral lower legs onset this afternoon. Pt states her symptoms began after being in the sun for a couple of hours today. No hx of similar symptoms. Pt has a wound on her right lower leg that is not new. No contact with any plants. No new detergents. Pt denies fever or any other complaints at this time.  Past Medical History:  Diagnosis Date  . Hip fracture (HCC)   . Homeless   . Hypertension   . Morbid obesity (HCC)   . Pelvic fracture (HCC)   . Post-partum depression     There are no active problems to display for this patient.   History reviewed. No pertinent surgical history.  OB History    No data available       Home Medications    Prior to Admission medications   Medication Sig Start Date End Date Taking? Authorizing Provider  albuterol (PROVENTIL HFA;VENTOLIN HFA) 108 (90 Base) MCG/ACT inhaler Inhale 2 puffs into the lungs every 4 (four) hours as needed for wheezing or shortness of breath. 03/04/17   [name], MD  bumetanide (BUMEX) 0.5 MG tablet Take 1 tablet (0.5 mg total) by mouth daily. Patient not taking: Reported on 03/29/2017 03/04/17   [name], MD  diphenhydrAMINE (BENADRYL) 25 mg capsule Take 1 capsule (25 mg total) by mouth every 6 (six) hours as needed for itching. 05/01/17   Pricilla Loveless, MD  ferrous sulfate 325  (65 FE) MG tablet Take 325 mg by mouth daily with breakfast.    [provider]  senna (SENOKOT) 8.6 MG tablet Take 8.6 mg by mouth every evening. 09/19/16   [provider]  triamcinolone cream (KENALOG) 0.1 % Apply 1 application topically 4 (four) times daily as needed. 05/01/17   Pricilla Loveless, MD    Family History No family history on file.  Social History Social History  Substance Use Topics  . Smoking status: Former Smoker    Quit date: 11/09/2013  . Smokeless tobacco: Never Used  . Alcohol use No     Comment: OCCASIONAL     Allergies   Diazepam and Haldol [haloperidol lactate]   Review of Systems Review of Systems  Constitutional: Negative for fever.  Skin: Positive for wound.       Itching  All other systems reviewed and are negative.    Physical Exam Updated Vital Signs BP (!) 145/47   Pulse (!) 56   Resp 18   SpO2 97%   Physical Exam  Constitutional: She is oriented to person, place, and time. She appears well-developed and well-nourished.  HENT:  Head: Normocephalic and atraumatic.  Right Ear: External ear normal.  Left Ear: External ear normal.  Nose: Nose normal.  Eyes: Right eye exhibits no discharge. Left eye exhibits no discharge.  Cardiovascular: Normal rate, regular rhythm and intact distal pulses.   Pulmonary/Chest: Effort  normal.  Musculoskeletal: She exhibits no edema.  Neurological: She is alert and oriented to person, place, and time.  Skin: Skin is warm and dry.  Bilateral lower extremities with diffuse dry skin and mild redness consisted with scratching. Right lower leg with a chronic appearing superficial wound without drainage. No increased warmth or acute cellulitis.   Nursing note and vitals reviewed.    ED Treatments / Results  DIAGNOSTIC STUDIES: Oxygen Saturation is 99% on RA, normal by my interpretation.   COORDINATION OF CARE: 1:40 AM-Discussed next steps with pt including treatment with a steroid  cream. Pt verbalized understanding and is agreeable with the plan.   Labs (all labs ordered are listed, but only abnormal results are displayed) Labs Reviewed - No data to display  EKG  EKG Interpretation None       Radiology No results found.  Procedures Procedures (including critical care time)  Medications Ordered in ED Medications  diphenhydrAMINE (BENADRYL) capsule 25 mg (25 mg Oral Given 05/01/17 0152)     Initial Impression / Assessment and Plan / ED Course  I have reviewed the triage vital signs and the nursing notes.  Pertinent labs & imaging results that were available during my care of the patient were reviewed by me and considered in my medical decision making (see chart for details).     Patient presents with nonspecific redness and itching. No warmth or streaking. Unclear cause but appears to be irritated/dry skin. Discussed moisturizing and will place on triamcinolone and benadryl for itching/comfort. Discussed return precautions, will refer to a PCP. No indication this is currently infected.   Final Clinical Impressions(s) / ED Diagnoses   Final diagnoses:  Dermatitis    New Prescriptions Discharge Medication List as of 05/01/2017  1:47 AM    START taking these medications   Details  diphenhydrAMINE (BENADRYL) 25 mg capsule Take 1 capsule (25 mg total) by mouth every 6 (six) hours as needed for itching., Starting Fri 05/01/2017, Print    triamcinolone cream (KENALOG) 0.1 % Apply 1 application topically 4 (four) times daily as needed., Starting Fri 05/01/2017, Print       I personally performed the services described in this documentation, which was scribed in my presence. The recorded information has been reviewed and is accurate.     Pricilla LovelessGoldston, Gelena Klosinski, MD 05/01/17 [phone]

## 2017-05-01 NOTE — ED Triage Notes (Signed)
Patient reports itchy skin rashes at both feet onset today with redness/mild swelling.

## 2017-05-01 NOTE — ED Notes (Signed)
Patient left at this time with all belongings. 

## 2017-05-04 ENCOUNTER — Encounter (HOSPITAL_COMMUNITY): Payer: Self-pay | Admitting: Emergency Medicine

## 2017-05-04 DIAGNOSIS — Y998 Other external cause status: Secondary | ICD-10-CM | POA: Insufficient documentation

## 2017-05-04 DIAGNOSIS — W2209XA Striking against other stationary object, initial encounter: Secondary | ICD-10-CM | POA: Diagnosis not present

## 2017-05-04 DIAGNOSIS — I10 Essential (primary) hypertension: Secondary | ICD-10-CM | POA: Diagnosis not present

## 2017-05-04 DIAGNOSIS — Z87891 Personal history of nicotine dependence: Secondary | ICD-10-CM | POA: Diagnosis not present

## 2017-05-04 DIAGNOSIS — M79661 Pain in right lower leg: Secondary | ICD-10-CM | POA: Diagnosis not present

## 2017-05-04 DIAGNOSIS — Y939 Activity, unspecified: Secondary | ICD-10-CM | POA: Diagnosis not present

## 2017-05-04 DIAGNOSIS — S8011XA Contusion of right lower leg, initial encounter: Secondary | ICD-10-CM | POA: Insufficient documentation

## 2017-05-04 DIAGNOSIS — Y929 Unspecified place or not applicable: Secondary | ICD-10-CM | POA: Insufficient documentation

## 2017-05-04 DIAGNOSIS — S81821A Laceration with foreign body, right lower leg, initial encounter: Secondary | ICD-10-CM | POA: Diagnosis not present

## 2017-05-04 DIAGNOSIS — S8991XA Unspecified injury of right lower leg, initial encounter: Secondary | ICD-10-CM | POA: Diagnosis present
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
## 2017-05-05 ENCOUNTER — Emergency Department (HOSPITAL_COMMUNITY)
Admission: EM | Admit: 2017-05-05 | Discharge: 2017-05-05 | Disposition: A | Discharge status: Home / Self Care | Payer: Medicare Other | Attending: Emergency Medicine | Admitting: Emergency Medicine

## 2017-05-05 ENCOUNTER — Emergency Department (HOSPITAL_COMMUNITY): Payer: Medicare Other

## 2017-05-05 DIAGNOSIS — S8011XA Contusion of right lower leg, initial encounter: Secondary | ICD-10-CM

## 2017-05-05 DIAGNOSIS — M79604 Pain in right leg: Secondary | ICD-10-CM

## 2017-05-05 DIAGNOSIS — S81821A Laceration with foreign body, right lower leg, initial encounter: Secondary | ICD-10-CM | POA: Diagnosis not present

## 2017-05-05 MED ORDER — ACETAMINOPHEN 500 MG PO TABS: 500.0000 mg | ORAL_TABLET | Freq: Four times a day (QID) | ORAL | 0 refills | Status: DC | PRN

## 2017-05-05 MED ORDER — OXYCODONE-ACETAMINOPHEN 5-325 MG PO TABS
1.0000 | ORAL_TABLET | Freq: Once | ORAL | Status: AC
  Administered 2017-05-05: 1 via ORAL
  Filled 2017-05-05: qty 1

## 2017-05-05 NOTE — ED Notes (Signed)
Assisted Pt to bed. Once in Bed Pt asked to stay in her dress and assistance changing her brief. Assisted Pt with bedpan and a dry brief. Pt hooked up to BP and Pulse Ox.

## 2017-05-05 NOTE — Discharge Instructions (Signed)
You were seen today for pain in your right leg after hitting it. You likely have some bruising. Use ice and Tylenol as needed for discomfort.

## 2017-05-05 NOTE — ED Provider Notes (Signed)
[facility]-EMERGENCY DEPT Provider Note   CSN: [id number] Arrival date & time: 05/04/17  2255  By signing my name below, I, Diona BrownerJennifer Gorman, attest that this documentation has been prepared under the direction and in the presence of Kahlan Engebretson, Mayer Maskerourtney F, MD. Electronically Signed: Diona BrownerJennifer Gorman, ED Scribe. 05/05/17. 2:39 AM.  History   Chief Complaint Chief Complaint  Patient presents with  . Leg Pain    HPI Beth Hall is a 76 y.o. female who presents to the Emergency Department complaining of 8/10, gradually worsening, right leg pain s/p running her wheel chair into a counter the morning of 05/04/17. Associated sx include leg swelling. No bleeding at this time. Pt states she mainly uses her wheel chair, unless to go to the bathroom. She didn't take anything at home for pain. Pt denies fever.   The history is provided by the patient. No language interpreter was used.    Past Medical History:  Diagnosis Date  . Hip fracture (HCC)   . Homeless   . Hypertension   . Morbid obesity (HCC)   . Pelvic fracture (HCC)   . Post-partum depression     There are no active problems to display for this patient.   History reviewed. No pertinent surgical history.  OB History    No data available       Home Medications    Prior to Admission medications   Medication Sig Start Date End Date Taking? Authorizing Provider  acetaminophen (TYLENOL) 500 MG tablet Take 1 tablet (500 mg total) by mouth every 6 (six) hours as needed. 05/05/17   Ivadell Gaul, Mayer Maskerourtney F, MD  albuterol (PROVENTIL HFA;VENTOLIN HFA) 108 (90 Base) MCG/ACT inhaler Inhale 2 puffs into the lungs every 4 (four) hours as needed for wheezing or shortness of breath. 03/04/17   Dione BoozeGlick, David, MD  bumetanide (BUMEX) 0.5 MG tablet Take 1 tablet (0.5 mg total) by mouth daily. Patient not taking: Reported on 03/29/2017 03/04/17   Dione BoozeGlick, David, MD  diphenhydrAMINE (BENADRYL) 25 mg capsule Take 1 capsule (25 mg total) by mouth every 6 (six)  hours as needed for itching. 05/01/17   Pricilla LovelessGoldston, Scott, MD  ferrous sulfate 325 (65 FE) MG tablet Take 325 mg by mouth daily with breakfast.    [provider]  senna (SENOKOT) 8.6 MG tablet Take 8.6 mg by mouth every evening. 09/19/16   [provider]  triamcinolone cream (KENALOG) 0.1 % Apply 1 application topically 4 (four) times daily as needed. 05/01/17   Pricilla LovelessGoldston, Scott, MD    Family History No family history on file.  Social History Social History  Substance Use Topics  . Smoking status: Former Smoker    Quit date: 11/09/2013  . Smokeless tobacco: Never Used  . Alcohol use No     Comment: OCCASIONAL     Allergies   Diazepam and Haldol [haloperidol lactate]   Review of Systems Review of Systems  Constitutional: Negative for fever.  Respiratory: Negative for shortness of breath.   Cardiovascular: Positive for leg swelling. Negative for chest pain.  Musculoskeletal: Positive for arthralgias and joint swelling.  Skin: Positive for wound.  All other systems reviewed and are negative.    Physical Exam Updated Vital Signs BP (!) 169/75   Pulse 62   Temp 98 F (36.7 C)   Resp 18   SpO2 98%   Physical Exam  Constitutional: She is oriented to person, place, and time. She appears well-developed and well-nourished.  Obese, no acute distress  HENT:  Head: Normocephalic and atraumatic.  Cardiovascular: Normal rate, regular rhythm and normal heart sounds.   No murmur heard. Pulmonary/Chest: Effort normal and breath sounds normal. No respiratory distress. She has no wheezes.  Abdominal: Soft. There is no tenderness.  Musculoskeletal: She exhibits edema.  2+ bilateral lower extremity edema, tenderness to palpation over the mid right anterior shin, abrasion noted, no adjacent erythema or significant skin changes  Neurological: She is alert and oriented to person, place, and time.  Skin: Skin is warm and dry.  Nursing note and vitals reviewed.    ED  Treatments / Results  DIAGNOSTIC STUDIES: Oxygen Saturation is 96% on RA, adequate by my interpretation.   COORDINATION OF CARE: 2:39 AM-Discussed next steps with pt which includes an XR. Pt verbalized understanding and is agreeable with the plan.   Labs (all labs ordered are listed, but only abnormal results are displayed) Labs Reviewed - No data to display  EKG  EKG Interpretation None       Radiology Dg Tibia/fibula Right  Result Date: 05/05/2017 CLINICAL DATA:  Knee caught between board and wall. Laceration and pain. EXAM: RIGHT TIBIA AND FIBULA - 2 VIEW COMPARISON:  None. FINDINGS: There is no evidence of fracture or other focal bone lesions. Soft tissue planes are nonsuspicious. Mild vascular calcifications. IMPRESSION: Negative. Electronically Signed   By: Awilda Metro M.D.   On: 05/05/2017 03:31    Procedures Procedures (including critical care time)  Medications Ordered in ED Medications  oxyCODONE-acetaminophen (PERCOCET/ROXICET) 5-325 MG per tablet 1 tablet (1 tablet Oral Given 05/05/17 0302)     Initial Impression / Assessment and Plan / ED Course  I have reviewed the triage vital signs and the nursing notes.  Pertinent labs & imaging results that were available during my care of the patient were reviewed by me and considered in my medical decision making (see chart for details).     Patient presents with right leg pain after hitting it on the wall while riding in her wheelchair. Injury occurred one day ago. She has symmetric lower extremity swelling and an abrasion to her right lower extremity. X-rays are negative. She mostly gets around in her wheelchair but does ambulate to the bathroom. Will discharge home with supportive measures including Tylenol for pain and ice as needed.  After history, exam, and medical workup I feel the patient has been appropriately medically screened and is safe for discharge home. Pertinent diagnoses were discussed with the  patient. Patient was given return precautions.   Final Clinical Impressions(s) / ED Diagnoses   Final diagnoses:  Right leg pain  Contusion of right lower leg, initial encounter    New Prescriptions New Prescriptions   ACETAMINOPHEN (TYLENOL) 500 MG TABLET    Take 1 tablet (500 mg total) by mouth every 6 (six) hours as needed.   I personally performed the services described in this documentation, which was scribed in my presence. The recorded information has been reviewed and is accurate.     Shon Baton, MD 05/05/17 [phone]

## 2017-05-12 ENCOUNTER — Encounter: Payer: Self-pay | Admitting: Physician Assistant

## 2017-05-12 ENCOUNTER — Encounter (HOSPITAL_COMMUNITY): Payer: Self-pay | Admitting: Emergency Medicine

## 2017-05-12 ENCOUNTER — Emergency Department (HOSPITAL_COMMUNITY)
Admission: EM | Admit: 2017-05-12 | Discharge: 2017-05-12 | Disposition: A | Discharge status: Home / Self Care | Payer: Medicare Other | Attending: Emergency Medicine | Admitting: Emergency Medicine

## 2017-05-12 ENCOUNTER — Ambulatory Visit (HOSPITAL_BASED_OUTPATIENT_CLINIC_OR_DEPARTMENT_OTHER): Payer: Medicare Other | Admitting: Physician Assistant

## 2017-05-12 ENCOUNTER — Ambulatory Visit: Payer: Medicare Other | Admitting: Licensed Clinical Social Worker

## 2017-05-12 VITALS — BP 167/77 | HR 54 | Temp 98.1°F | Resp 20 | Wt 278.2 lb

## 2017-05-12 DIAGNOSIS — Z79899 Other long term (current) drug therapy: Secondary | ICD-10-CM | POA: Insufficient documentation

## 2017-05-12 DIAGNOSIS — K5900 Constipation, unspecified: Secondary | ICD-10-CM | POA: Insufficient documentation

## 2017-05-12 DIAGNOSIS — Z6841 Body Mass Index (BMI) 40.0 and over, adult: Secondary | ICD-10-CM | POA: Insufficient documentation

## 2017-05-12 DIAGNOSIS — Z888 Allergy status to other drugs, medicaments and biological substances status: Secondary | ICD-10-CM | POA: Insufficient documentation

## 2017-05-12 DIAGNOSIS — F419 Anxiety disorder, unspecified: Secondary | ICD-10-CM | POA: Insufficient documentation

## 2017-05-12 DIAGNOSIS — Z5900 Homelessness unspecified: Secondary | ICD-10-CM

## 2017-05-12 DIAGNOSIS — I10 Essential (primary) hypertension: Secondary | ICD-10-CM

## 2017-05-12 DIAGNOSIS — Z87891 Personal history of nicotine dependence: Secondary | ICD-10-CM | POA: Insufficient documentation

## 2017-05-12 DIAGNOSIS — B351 Tinea unguium: Secondary | ICD-10-CM | POA: Insufficient documentation

## 2017-05-12 DIAGNOSIS — L608 Other nail disorders: Secondary | ICD-10-CM

## 2017-05-12 MED ORDER — DOCUSATE SODIUM 100 MG PO CAPS
100.0000 mg | ORAL_CAPSULE | Freq: Once | ORAL | Status: AC
  Administered 2017-05-12: 100 mg via ORAL
  Filled 2017-05-12: qty 1

## 2017-05-12 MED ORDER — DOCUSATE SODIUM 100 MG PO CAPS: 100.0000 mg | ORAL_CAPSULE | Freq: Two times a day (BID) | ORAL | 1 refills | Status: DC

## 2017-05-12 MED FILL — TRIAMCINOLONE 0.1% CREAM: 0.1 | 15 days supply | Qty: 30 | Fill #0

## 2017-05-12 NOTE — BH Specialist Note (Signed)
Integrated Behavioral Health Initial Visit  MRN: [id number] Name: Beth Hall   Session Start time: 2:20 PM Session End time: 3:00 PM Total time: 40 minutes  Type of Service: Integrated Behavioral Health- Individual/Family Interpretor:No. Interpretor Name and Language: N/A   Warm Hand Off Completed.       SUBJECTIVE: Beth Hall is a 76 y.o. female accompanied by patient. Patient was referred by Beth Hall for anxiety. Patient reports the following symptoms/concerns: overwhelming feelings of worry and homelessness Duration of problem: "A couple of days"; Severity of problem: mild  OBJECTIVE: Mood: Anxious and Affect: Appropriate Risk of harm to self or others: No plan to harm self or others   LIFE CONTEXT: Family and Social: Pt reports that she was residing with family; however, due to ongoing conflict she is currently homeless. She has [facility] [address]  School/Work: Pt receives [employer] [phone]($1387) and Medicare.  Self-Care: Pt reports that she enjoys reading the bible and writing to cope with stressors Life Changes: Pt is homeless and has limited support.  GOALS ADDRESSED: Patient will reduce symptoms of: anxiety and stress and increase knowledge and/or ability of: coping skills and also: Increase adequate support systems for patient/family   INTERVENTIONS: Solution-Focused Strategies, Supportive Counseling, Psychoeducation and/or Health Education and Link to [facility]  Standardized Assessments completed: GAD-7 and PHQ 2&9  ASSESSMENT: Patient currently experiencing anxiety triggered by ongoing family conflict that has resulted in homelessness. Patient receives limited support noting [facility] to assist in services. Patient may benefit from psychoeducation and psychotherapy. LCSWA educated pt on how stress can negatively impact one's physical and mental health. Pt identified healthy strategies to utilize on a  daily basis. LCSWA encouraged patient to [facility] up [facility] to assist with emergency housing and obtaining stable housing. Pt was provided with food, hygiene items, and crisis intervention resources.   PLAN: 1. Follow up with behavioral health clinician on : Pt was encouraged to contact LCSWA if symptoms worsen or fail to improve to [facility]. 2. Behavioral recommendations: LCSWA recommends that pt apply healthy coping skills discussed. Pt is encouraged to schedule follow up appointment with LCSWA 3. Referral(s): [facility] ([facility]/Outside Clinic) and [facility]:  Food and Housing 4. "From scale of 1-10, how likely are you to follow plan?": 7/10  Bridgett LarssonJasmine D Karmello Abercrombie, LCSW 05/15/17 5:36 PM

## 2017-05-12 NOTE — ED Triage Notes (Signed)
Patient arrives from lobby where she has been sitting for a several hours. States she was seen at PCP for stomach problems and asked if she could come back over here to be observed in case the problem reoccurred. States that she has had numerous but small bowel movements since 3pm today while she has been here. Then she ate 2 containers of apple sauce and had some cramping pain followed by another bowel movement. Requests that she be allowed to wait here in observation only, and states "why do I have to see a doctor?", "I just want to wait here in observation". Also asked that we restart her iron and stool softener pills as she believes that is the whole problem.

## 2017-05-12 NOTE — Progress Notes (Signed)
Beth AlandDeborah Hall  WUJ:[id number]  [id number]  DOB - 05/11/1941  Chief Complaint  Patient presents with  . Follow-up       Subjective:   Beth AlandDeborah Hall is a 76 y.o. female here today for establishment of care. She has a past medical history of hypertension. She is homeless. She's had multiple emergency department visits in 2018. The most recent one was today. She had some constipation but actually was able to have a bowel movement prior to proceeding to the emergency room. She went to the emergency room because of lack of any where else to go. When told that she needed to check in or move on she chose to check in, claiming constipation. No acute findings. She was given some Colace and told to follow-up here. No new complaints at this time. Expresses difficult social situation. She doesn't drink or smoke cigarettes. Intermittently compliant with follow-up medications.  ROS: GEN: denies fever or chills, denies change in weight Skin: denies lesions or rashes HEENT: denies headache, earache, epistaxis, sore throat, or neck pain LUNGS: denies SHOB, dyspnea, PND, orthopnea CV: denies CP or palpitations ABD: denies abd pain, N or V +constipation EXT: denies muscle spasms or swelling; no pain in lower ext, no weakness NEURO: denies numbness or tingling, denies sz, stroke or TIA   ALLERGIES: Allergies  Allergen Reactions  . Diazepam Other (See Comments)    Other Reaction: Dry mouth  . Haldol [Haloperidol Lactate] Other (See Comments)    CATATONIC EPISODES    PAST MEDICAL HISTORY: Past Medical History:  Diagnosis Date  . Hip fracture (HCC)   . Homeless   . Hypertension   . Morbid obesity (HCC)   . Pelvic fracture (HCC)   . Post-partum depression     PAST SURGICAL HISTORY: No past surgical history on file.  MEDICATIONS AT HOME: Prior to Admission medications   Medication Sig Start Date End Date Taking? Authorizing Provider  acetaminophen (TYLENOL) 500 MG tablet Take 1  tablet (500 mg total) by mouth every 6 (six) hours as needed. 05/05/17  Yes Horton, Mayer Maskerourtney F, MD  albuterol (PROVENTIL HFA;VENTOLIN HFA) 108 (90 Base) MCG/ACT inhaler Inhale 2 puffs into the lungs every 4 (four) hours as needed for wheezing or shortness of breath. 03/04/17  Yes Dione BoozeGlick, David, MD  diphenhydrAMINE (BENADRYL) 25 mg capsule Take 1 capsule (25 mg total) by mouth every 6 (six) hours as needed for itching. 05/01/17  Yes Pricilla LovelessGoldston, Scott, MD  docusate sodium (COLACE) 100 MG capsule Take 1 capsule (100 mg total) by mouth every 12 (twelve) hours. 05/12/17  Yes Ward, Baxter HireKristen N, DO  ferrous sulfate 325 (65 FE) MG tablet Take 325 mg by mouth daily with breakfast.   Yes [provider]  senna (SENOKOT) 8.6 MG tablet Take 8.6 mg by mouth every evening. 09/19/16  Yes [provider]  triamcinolone cream (KENALOG) 0.1 % Apply 1 application topically 4 (four) times daily as needed. 05/01/17  Yes Pricilla LovelessGoldston, Scott, MD  bumetanide (BUMEX) 0.5 MG tablet Take 1 tablet (0.5 mg total) by mouth daily. Patient not taking: Reported on 03/29/2017 03/04/17   Dione BoozeGlick, David, MD    Family-noncontributory  Social-unmarried, homeless, has children, nonsmoker  Objective:   Vitals:   05/12/17 1356  BP: (!) 167/77  Pulse: (!) 54  Resp: 20  Temp: 98.1 F (36.7 C)  SpO2: 96%  Weight: 278 lb 3.2 oz (126.2 kg)    Exam General appearance : Awake, alert, not in any distress. Speech Clear. Not toxic  looking HEENT: Atraumatic and Normocephalic, pupils equally reactive to light and accomodation Neck: supple, no JVD. No cervical lymphadenopathy.  Chest:Good air entry bilaterally, no added sounds  CVS: S1 S2 regular, no murmurs.  Abdomen: Bowel sounds present, Non tender and not distended with no guarding, rigidity or rebound. Extremities: 2-3 + edema, mostly dependent; thick, long, hard nails with discoloration Neurology: Awake alert, and oriented X 3, CN II-XII intact, Non focal Skin:No  Rash Wounds:N/A   Assessment & Plan  1. HTN  -declines medications; does not believe that she has high blood pressure  -DASH diet  2. Homelessness   -visit today by Windy Fast  3. Onchymycosis  -podiatry referral 4. Constipation  -prn stool softener  A great deal of time today spent with LCSW, Jenel Lucks 4 weeks  The patient was given clear instructions to go to ER or return to medical center if symptoms don't improve, worsen or new problems develop. The patient verbalized understanding. The patient was told to call to get lab results if they haven't heard anything in the next week.    This note has been created with Education officer, environmental. Any transcriptional errors are unintentional.    Scot Jun, PA-C [facility] and [facility] [place], [place] [phone]   05/12/2017, 2:18 PM

## 2017-05-12 NOTE — ED Provider Notes (Signed)
TIME SEEN: 2:28 AM  CHIEF COMPLAINT: Constipation  HPI: Patient is a 76 year old female with history of hypertension, homelessness who presents to the emergency department with complaints of constipation. Patient reports she was actually sitting in the waiting room and was told that she could not stay there any longer so she decided to check in. She states she has had intermittent constipation but over the past 24 hours has had for nonbloody, normal-appearing bowel movements. She states previous to this she was having diffuse abdominal cramping but now that she is having bowel movements is gone. She's been off of stool softeners per the past month. She is requesting a prescription for the same and a dose here in the emergency department. She denies fevers, chills, nausea or vomiting, diarrhea. No current complaints other than wanting a prescription for stool softeners.  ROS: See HPI Constitutional: no fever  Eyes: no drainage  ENT: no runny nose   Cardiovascular:  no chest pain  Resp: no SOB  GI: no vomiting GU: no dysuria Integumentary: no rash  Allergy: no hives  Musculoskeletal: no leg swelling  Neurological: no slurred speech ROS otherwise negative  PAST MEDICAL HISTORY/PAST SURGICAL HISTORY:  Past Medical History:  Diagnosis Date  . Hip fracture (HCC)   . Homeless   . Hypertension   . Morbid obesity (HCC)   . Pelvic fracture (HCC)   . Post-partum depression     MEDICATIONS:  Prior to Admission medications   Medication Sig Start Date End Date Taking? Authorizing Provider  acetaminophen (TYLENOL) 500 MG tablet Take 1 tablet (500 mg total) by mouth every 6 (six) hours as needed. 05/05/17   Horton, Mayer Masker, MD  albuterol (PROVENTIL HFA;VENTOLIN HFA) 108 (90 Base) MCG/ACT inhaler Inhale 2 puffs into the lungs every 4 (four) hours as needed for wheezing or shortness of breath. 03/04/17   [name], MD  bumetanide (BUMEX) 0.5 MG tablet Take 1 tablet (0.5 mg total) by mouth  daily. Patient not taking: Reported on 03/29/2017 03/04/17   [name], MD  diphenhydrAMINE (BENADRYL) 25 mg capsule Take 1 capsule (25 mg total) by mouth every 6 (six) hours as needed for itching. 05/01/17   Pricilla Loveless, MD  ferrous sulfate 325 (65 FE) MG tablet Take 325 mg by mouth daily with breakfast.    [provider]  senna (SENOKOT) 8.6 MG tablet Take 8.6 mg by mouth every evening. 09/19/16   [provider]  triamcinolone cream (KENALOG) 0.1 % Apply 1 application topically 4 (four) times daily as needed. 05/01/17   Pricilla Loveless, MD    ALLERGIES:  Allergies  Allergen Reactions  . Diazepam Other (See Comments)    Other Reaction: Dry mouth  . Haldol [Haloperidol Lactate] Other (See Comments)    CATATONIC EPISODES    SOCIAL HISTORY:  Social History  Substance Use Topics  . Smoking status: Former Smoker    Quit date: 11/09/2013  . Smokeless tobacco: Never Used  . Alcohol use No     Comment: OCCASIONAL    FAMILY HISTORY: History reviewed. No pertinent family history.  EXAM: BP (!) 185/64 (BP Location: Left Arm)   Pulse 63   Temp 98 F (36.7 C) (Oral)   Resp 18   Ht 5\' 2"  (1.575 m)   Wt 124.3 kg (274 lb)   SpO2 97%   BMI 50.12 kg/m  CONSTITUTIONAL: Alert and oriented and responds appropriately to questions. Well-appearing; well-nourished, Afebrile and nontoxic HEAD: Normocephalic EYES: Conjunctivae clear, pupils appear equal, EOMI  ENT: normal nose; moist mucous membranes NECK: Supple, no meningismus, no nuchal rigidity, no LAD  CARD: RRR; S1 and S2 appreciated; no murmurs, no clicks, no rubs, no gallops RESP: Normal chest excursion without splinting or tachypnea; breath sounds clear and equal bilaterally; no wheezes, no rhonchi, no rales, no hypoxia or respiratory distress, speaking full sentences ABD/GI: Normal bowel sounds; non-distended; soft, non-tender, no rebound, no guarding, no peritoneal signs, no hepatosplenomegaly BACK:  The back  appears normal and is non-tender to palpation, there is no CVA tenderness EXT: Normal ROM in all joints; non-tender to palpation; chronic edema with blisters in bilateral lower extremities which are being followed by her primary care provider with no erythema or warmth and no open wound; normal capillary refill; no cyanosis, no calf tenderness or swelling    SKIN: Normal color for age and race; warm; no rash NEURO: Moves all extremities equally PSYCH: The patient's mood and manner are appropriate. Grooming and personal hygiene are appropriate.  MEDICAL DECISION MAKING: Patient here with complaints of constipation that has resolved on its own. She is requesting a stool softener. Abdominal exam benign. No fevers, vomiting. Has had several bowel movements in the past 24 hours. Nothing at this time to suggest bowel obstruction or fecal impaction. We'll discharge with Colace. I do not feel she needs further emergent workup. I think that the main reason that she was in the waiting room was that she is homeless and has nowhere to stay at this time. She states she is working with [employer] [facility] to obtain housing. She reports to me that she has a hotel that she can stay. She states the reason she checked in was because she was told that she could not stay in the waiting room and a longer.   At this time, I do not feel there is any life-threatening condition present. I have reviewed and discussed all results (EKG, imaging, lab, urine as appropriate) and exam findings with patient/family. I have reviewed nursing notes and appropriate previous records.  I feel the patient is safe to be discharged home without further emergent workup and can continue workup as an outpatient as needed. Discussed usual and customary return precautions. Patient/family verbalize understanding and are comfortable with this plan.  Outpatient follow-up has been provided if needed. All questions have been answered.      Ward, Layla MawKristen N,  DO 05/12/17 [phone]

## 2017-05-12 NOTE — Progress Notes (Signed)
Wound on right leg BLE edema

## 2017-05-13 ENCOUNTER — Encounter (HOSPITAL_COMMUNITY): Payer: Self-pay

## 2017-05-13 ENCOUNTER — Emergency Department (HOSPITAL_COMMUNITY)
Admission: EM | Admit: 2017-05-13 | Discharge: 2017-05-13 | Disposition: A | Discharge status: Home / Self Care | Payer: Medicare Other | Attending: Emergency Medicine | Admitting: Emergency Medicine

## 2017-05-13 DIAGNOSIS — Z87891 Personal history of nicotine dependence: Secondary | ICD-10-CM | POA: Insufficient documentation

## 2017-05-13 DIAGNOSIS — I10 Essential (primary) hypertension: Secondary | ICD-10-CM | POA: Diagnosis not present

## 2017-05-13 DIAGNOSIS — Z79899 Other long term (current) drug therapy: Secondary | ICD-10-CM | POA: Insufficient documentation

## 2017-05-13 MED ORDER — AMLODIPINE BESYLATE 5 MG PO TABS: 5.0000 mg | ORAL_TABLET | Freq: Every day | ORAL | 0 refills | Status: DC

## 2017-05-13 NOTE — ED Triage Notes (Signed)
Pt complains of high blood pressure and bilateral swollen legs and feet

## 2017-05-13 NOTE — ED Provider Notes (Signed)
[facility]-EMERGENCY DEPT Provider Note   CSN: [id number] Arrival date & time: 05/13/17  0023   By signing my name below, I, Clarisse GougeXavier Herndon, attest that this documentation has been prepared under the direction and in the presence of Vantasia Pinkney, MD. Electronically signed, Clarisse GougeXavier Herndon, ED Scribe. 05/13/17. 3:47 AM.   History   Chief Complaint Chief Complaint  Patient presents with  . Hypertension   The history is provided by the patient and medical records. No language interpreter was used.  Hypertension  This is a chronic problem. The current episode started more than 1 week ago. The problem occurs constantly. The problem has been gradually worsening. Pertinent negatives include no chest pain, no abdominal pain, no headaches and no shortness of breath. Nothing aggravates the symptoms. Nothing relieves the symptoms. She has tried nothing for the symptoms. The treatment provided no relief.    Beth Hall is a 76 y.o. female with h/o HTN, morbid obesity and homelessness presenting to the Emergency Department concerning bilateral leg swelling she noticed within the last 24-48 hours. She also expresses concern for high blood pressure readings. She states she has been prescribed medications for HTN in the past, but she refused to take them because she did not "accept the fact" that she had HTN. Pt notes she was evaluated by her PCP yesterday, when she was prescribed medications for this issue. No other complaints at this time.   Past Medical History:  Diagnosis Date  . Hip fracture (HCC)   . Homeless   . Hypertension   . Morbid obesity (HCC)   . Pelvic fracture (HCC)   . Post-partum depression     There are no active problems to display for this patient.   History reviewed. No pertinent surgical history.  OB History    No data available       Home Medications    Prior to Admission medications   Medication Sig Start Date End Date Taking? Authorizing Provider  acetaminophen  (TYLENOL) 500 MG tablet Take 1 tablet (500 mg total) by mouth every 6 (six) hours as needed. 05/05/17   Horton, Mayer Maskerourtney F, MD  albuterol (PROVENTIL HFA;VENTOLIN HFA) 108 (90 Base) MCG/ACT inhaler Inhale 2 puffs into the lungs every 4 (four) hours as needed for wheezing or shortness of breath. 03/04/17   Dione BoozeGlick, David, MD  bumetanide (BUMEX) 0.5 MG tablet Take 1 tablet (0.5 mg total) by mouth daily. Patient not taking: Reported on 03/29/2017 03/04/17   Dione BoozeGlick, David, MD  diphenhydrAMINE (BENADRYL) 25 mg capsule Take 1 capsule (25 mg total) by mouth every 6 (six) hours as needed for itching. 05/01/17   Pricilla LovelessGoldston, Scott, MD  docusate sodium (COLACE) 100 MG capsule Take 1 capsule (100 mg total) by mouth every 12 (twelve) hours. 05/12/17   Ward, Layla MawKristen N, DO  ferrous sulfate 325 (65 FE) MG tablet Take 325 mg by mouth daily with breakfast.    [provider]  senna (SENOKOT) 8.6 MG tablet Take 8.6 mg by mouth every evening. 09/19/16   [provider]  triamcinolone cream (KENALOG) 0.1 % Apply 1 application topically 4 (four) times daily as needed. 05/01/17   Pricilla LovelessGoldston, Scott, MD    Family History History reviewed. No pertinent family history.  Social History Social History  Substance Use Topics  . Smoking status: Former Smoker    Quit date: 11/09/2013  . Smokeless tobacco: Never Used  . Alcohol use No     Comment: OCCASIONAL     Allergies  Diazepam and Haldol [haloperidol lactate]   Review of Systems Review of Systems  Constitutional: Negative for fatigue and fever.  Respiratory: Negative for shortness of breath.   Cardiovascular: Negative for chest pain, palpitations and leg swelling.  Gastrointestinal: Negative for abdominal pain, nausea and vomiting.  Neurological: Negative for headaches.  All other systems reviewed and are negative.    Physical Exam Updated Vital Signs BP (!) 193/82 (BP Location: Right Arm)   Pulse 79   Temp 98.6 F (37 C) (Oral)   Resp 18   SpO2  94%   Physical Exam  Constitutional: She is oriented to person, place, and time. She appears well-developed and well-nourished. No distress.  HENT:  Head: Normocephalic and atraumatic.  Mouth/Throat: Oropharynx is clear and moist and mucous membranes are normal. No oropharyngeal exudate.  Eyes: Pupils are equal, round, and reactive to light. Conjunctivae are normal.  Neck: Normal range of motion. Neck supple. No JVD present. Carotid bruit is not present.  Cardiovascular: Normal rate, regular rhythm, normal heart sounds and intact distal pulses.   Pulmonary/Chest: Effort normal and breath sounds normal. No stridor.  Abdominal: Soft. Bowel sounds are normal. She exhibits no fluid wave and no mass. There is no tenderness. There is no rebound and no guarding.  Musculoskeletal: Normal range of motion. She exhibits no edema, tenderness or deformity.  Dependent pedal edema  Neurological: She is alert and oriented to person, place, and time. She displays normal reflexes.  Skin: Skin is warm and dry. Capillary refill takes less than 2 seconds.  Psychiatric: She has a normal mood and affect.  Nursing note and vitals reviewed.    ED Treatments / Results   Vitals:   05/13/17 0038 05/13/17 0408  BP: (!) 193/82 (!) 162/72  Pulse: 79 96  Resp: 18 19  Temp: 98.6 F (37 C)     DIAGNOSTIC STUDIES: Oxygen Saturation is 94% on RA, adequate by my interpretation.    COORDINATION OF CARE: 3:42 AM-Discussed next steps with pt. Pt verbalized understanding and is agreeable with the plan. Will order blood pressure recheck.   Radiology No results found.  Procedures Procedures (including critical care time)    Final Clinical Impressions(s) / ED Diagnoses  BP improved without intervention in the ED.  Return for chest pain, leg pain, fever,  inability to tolerate oral medication, worsening pain, fevers, vomiting, altered level of consciousness, or any concerns. No signs of systemic illness or  infection. The patient is nontoxic-appearing on exam and vital signs are within normal limits.   I have reviewed the triage vital signs and the nursing notes. Pertinent labs &imaging results that were available during my care of the patient were reviewed by me and considered in my medical decision making (see chart for details).  After history, exam, and medical workup I feel the patient has been appropriately medically screened and is safe for discharge home. Pertinent diagnoses were discussed with the patient. Patient was given return precautions.   I personally performed the services described in this documentation, which was scribed in my presence. The recorded information has been reviewed and is accurate.      Redonna Wilbert, MD 05/13/17 [id number]

## 2017-05-20 ENCOUNTER — Emergency Department (HOSPITAL_COMMUNITY)
Admission: EM | Admit: 2017-05-20 | Discharge: 2017-05-21 | Disposition: A | Discharge status: Home / Self Care | Payer: Medicare Other | Attending: Emergency Medicine | Admitting: Emergency Medicine

## 2017-05-20 ENCOUNTER — Encounter (HOSPITAL_COMMUNITY): Payer: Self-pay

## 2017-05-20 DIAGNOSIS — Z87891 Personal history of nicotine dependence: Secondary | ICD-10-CM | POA: Insufficient documentation

## 2017-05-20 DIAGNOSIS — M25562 Pain in left knee: Secondary | ICD-10-CM | POA: Insufficient documentation

## 2017-05-20 DIAGNOSIS — Y929 Unspecified place or not applicable: Secondary | ICD-10-CM | POA: Insufficient documentation

## 2017-05-20 DIAGNOSIS — G8929 Other chronic pain: Secondary | ICD-10-CM

## 2017-05-20 DIAGNOSIS — Z6841 Body Mass Index (BMI) 40.0 and over, adult: Secondary | ICD-10-CM | POA: Diagnosis not present

## 2017-05-20 DIAGNOSIS — S32391A Other fracture of right ilium, initial encounter for closed fracture: Secondary | ICD-10-CM

## 2017-05-20 DIAGNOSIS — Y939 Activity, unspecified: Secondary | ICD-10-CM | POA: Insufficient documentation

## 2017-05-20 DIAGNOSIS — I10 Essential (primary) hypertension: Secondary | ICD-10-CM | POA: Insufficient documentation

## 2017-05-20 DIAGNOSIS — M25559 Pain in unspecified hip: Secondary | ICD-10-CM

## 2017-05-20 DIAGNOSIS — Y999 Unspecified external cause status: Secondary | ICD-10-CM | POA: Diagnosis not present

## 2017-05-20 DIAGNOSIS — W1830XA Fall on same level, unspecified, initial encounter: Secondary | ICD-10-CM | POA: Diagnosis not present

## 2017-05-20 DIAGNOSIS — Z590 Homelessness: Secondary | ICD-10-CM | POA: Insufficient documentation

## 2017-05-20 DIAGNOSIS — Z79899 Other long term (current) drug therapy: Secondary | ICD-10-CM | POA: Diagnosis not present

## 2017-05-20 DIAGNOSIS — M25561 Pain in right knee: Secondary | ICD-10-CM | POA: Diagnosis not present

## 2017-05-20 DIAGNOSIS — M25551 Pain in right hip: Secondary | ICD-10-CM | POA: Diagnosis present

## 2017-05-20 NOTE — ED Notes (Signed)
Bed: [id number] Expected date:  Expected time:  Means of arrival:  Comments: heldNo bed

## 2017-05-20 NOTE — ED Notes (Signed)
Patient states she is homeless-states she was at the [facility] in their lobby because they did not have beds.

## 2017-05-20 NOTE — ED Triage Notes (Signed)
Pt states she fell when trying to get back in her motorized chair tonight, she states that she has bad knees and the arm rest was unstable

## 2017-05-21 ENCOUNTER — Emergency Department (HOSPITAL_COMMUNITY): Payer: Medicare Other

## 2017-05-21 ENCOUNTER — Ambulatory Visit (HOSPITAL_COMMUNITY): Admission: RE | Admit: 2017-05-21 | Payer: Medicare Other | Source: Ambulatory Visit

## 2017-05-21 DIAGNOSIS — I998 Other disorder of circulatory system: Secondary | ICD-10-CM | POA: Diagnosis not present

## 2017-05-21 DIAGNOSIS — S81809A Unspecified open wound, unspecified lower leg, initial encounter: Secondary | ICD-10-CM | POA: Diagnosis not present

## 2017-05-21 DIAGNOSIS — M170 Bilateral primary osteoarthritis of knee: Secondary | ICD-10-CM | POA: Diagnosis not present

## 2017-05-21 DIAGNOSIS — S39003A Unspecified injury of muscle, fascia and tendon of pelvis, initial encounter: Secondary | ICD-10-CM | POA: Diagnosis not present

## 2017-05-21 DIAGNOSIS — M25552 Pain in left hip: Secondary | ICD-10-CM | POA: Diagnosis not present

## 2017-05-21 DIAGNOSIS — M79661 Pain in right lower leg: Secondary | ICD-10-CM | POA: Diagnosis not present

## 2017-05-21 DIAGNOSIS — M25551 Pain in right hip: Secondary | ICD-10-CM | POA: Diagnosis not present

## 2017-05-21 DIAGNOSIS — S92102D Unspecified fracture of left talus, subsequent encounter for fracture with routine healing: Secondary | ICD-10-CM | POA: Diagnosis not present

## 2017-05-21 DIAGNOSIS — M19072 Primary osteoarthritis, left ankle and foot: Secondary | ICD-10-CM | POA: Diagnosis not present

## 2017-05-21 DIAGNOSIS — M79662 Pain in left lower leg: Secondary | ICD-10-CM | POA: Diagnosis not present

## 2017-05-21 DIAGNOSIS — S8991XA Unspecified injury of right lower leg, initial encounter: Secondary | ICD-10-CM | POA: Diagnosis not present

## 2017-05-21 DIAGNOSIS — S8992XA Unspecified injury of left lower leg, initial encounter: Secondary | ICD-10-CM | POA: Diagnosis not present

## 2017-05-21 DIAGNOSIS — M898X6 Other specified disorders of bone, lower leg: Secondary | ICD-10-CM | POA: Diagnosis not present

## 2017-05-21 LAB — I-STAT CHEM 8, ED
BUN: 15 mg/dL (ref 6–20)
CALCIUM ION: 1.09 mmol/L — AB (ref 1.15–1.40)
Chloride: 107 mmol/L (ref 101–111)
Creatinine, Ser: 0.9 mg/dL (ref 0.44–1.00)
Glucose, Bld: 115 mg/dL — ABNORMAL HIGH (ref 65–99)
HCT: 34 % — ABNORMAL LOW (ref 36.0–46.0)
HEMOGLOBIN: 11.6 g/dL — AB (ref 12.0–15.0)
Potassium: 4.4 mmol/L (ref 3.5–5.1)
SODIUM: 142 mmol/L (ref 135–145)
TCO2: 26 mmol/L (ref 0–100)

## 2017-05-21 NOTE — ED Notes (Signed)
Patient refused CT scan. Even after multiple attempts at explaining the necessity and process of the test by this RN, CT tech, and provider, patient continues to refuse.

## 2017-05-21 NOTE — ED Provider Notes (Signed)
[facility]ER[facility]Y DEPT Provider Note   CSN: [id number] Arrival date & time: 05/20/17  2249     History   Chief Complaint Chief Complaint  Patient presents with  . Fall  . Hip Pain    HPI Wallace GoingDeborah L Vanegas is a 76 y.o. female.  HPI  This is a 76 year old female with a history of hypertension, morbidly obesity, pelvic fractures who presents with bilateral knee pain and right hip pain following a fall. Patient reports that she fell on Sunday. She reports that she fell forward on her bilateral knees. At baseline she uses a motorized wheelchair. She is able to transfer and walk to the bathroom. She is currently homeless. She is waiting on a bed at the shelter. She's not done anything for her pain. She's not taken anything over-the-counter. Current pain is 3 out of 10. Worse with transferring.  Past Medical History:  Diagnosis Date  . Hip fracture (HCC)   . Homeless   . Hypertension   . Morbid obesity (HCC)   . Pelvic fracture (HCC)   . Post-partum depression     There are no active problems to display for this patient.   History reviewed. No pertinent surgical history.  OB History    No data available       Home Medications    Prior to Admission medications   Medication Sig Start Date End Date Taking? Authorizing Provider  albuterol (PROVENTIL HFA;VENTOLIN HFA) 108 (90 Base) MCG/ACT inhaler Inhale 2 puffs into the lungs every 4 (four) hours as needed for wheezing or shortness of breath. 03/04/17  Yes Dione BoozeGlick, David, MD  diphenhydrAMINE (BENADRYL) 25 mg capsule Take 1 capsule (25 mg total) by mouth every 6 (six) hours as needed for itching. 05/01/17  Yes Pricilla LovelessGoldston, Scott, MD  docusate sodium (COLACE) 100 MG capsule Take 1 capsule (100 mg total) by mouth every 12 (twelve) hours. 05/12/17  Yes Ward, Layla MawKristen N, DO  senna (SENOKOT) 8.6 MG tablet Take 8.6 mg by mouth every evening. 09/19/16  Yes [provider]  triamcinolone cream (KENALOG) 0.1 % Apply 1 application topically 4  (four) times daily as needed. 05/01/17  Yes Pricilla LovelessGoldston, Scott, MD  acetaminophen (TYLENOL) 500 MG tablet Take 1 tablet (500 mg total) by mouth every 6 (six) hours as needed. Patient not taking: Reported on 05/21/2017 05/05/17   Jaunita Mikels, Mayer Maskerourtney F, MD  amLODipine (NORVASC) 5 MG tablet Take 1 tablet (5 mg total) by mouth daily. Patient not taking: Reported on 05/21/2017 05/13/17   Palumbo, April, MD  bumetanide (BUMEX) 0.5 MG tablet Take 1 tablet (0.5 mg total) by mouth daily. Patient not taking: Reported on 03/29/2017 03/04/17   Dione BoozeGlick, David, MD    Family History History reviewed. No pertinent family history.  Social History Social History  Substance Use Topics  . Smoking status: Former Smoker    Quit date: 11/09/2013  . Smokeless tobacco: Never Used  . Alcohol use No     Comment: OCCASIONAL     Allergies   Diazepam and Haldol [haloperidol lactate]   Review of Systems Review of Systems  Constitutional: Negative for fever.  Respiratory: Negative for shortness of breath.   Cardiovascular: Negative for chest pain.  Gastrointestinal: Negative for abdominal pain.  Musculoskeletal:       Bilateral knee pain, right hip pain  Neurological: Negative for weakness and numbness.  All other systems reviewed and are negative.    Physical Exam Updated Vital Signs BP (!) 144/71 (BP Location: Right Arm)   Pulse  78   Temp 98.1 F (36.7 C) (Oral)   Resp 20   Ht 5\' 3"  (1.6 m)   Wt 126.1 kg (278 lb)   SpO2 94%   BMI 49.25 kg/m   Physical Exam  Constitutional: She is oriented to person, place, and time.  Morbidly obese, disheveled appearing  HENT:  Head: Normocephalic and atraumatic.  Cardiovascular: Normal rate, regular rhythm and normal heart sounds.   No murmur heard. Pulmonary/Chest: Effort normal. No respiratory distress. She has no wheezes.  Limited by body habitus, clear breath sounds  Abdominal: Soft. Bowel sounds are normal. There is no tenderness. There is no guarding.    Musculoskeletal:  No obvious deformities of the bilateral lower extremities, normal range of motion of bilateral knees, given her body habitus, difficult to appreciate effusion, normal range of motion of the right hip without significant pain, 2+ DP pulse, 1-2+ bilateral lower extremity edema, no significant erythema  Neurological: She is alert and oriented to person, place, and time.  Skin: Skin is warm and dry.  Psychiatric: She has a normal mood and affect.  Nursing note and vitals reviewed.    ED Treatments / Results  Labs (all labs ordered are listed, but only abnormal results are displayed) Labs Reviewed  I-STAT CHEM 8, ED - Abnormal; Notable for the following:       Result Value   Glucose, Bld 115 (*)    Calcium, Ion 1.09 (*)    Hemoglobin 11.6 (*)    HCT 34.0 (*)    All other components within normal limits    EKG  EKG Interpretation None       Radiology Dg Pelvis 1-2 Views  Result Date: 05/21/2017 CLINICAL DATA:  Initial evaluation for acute trauma, fall. EXAM: PELVIS - 1-2 VIEW COMPARISON:  None. FINDINGS: There is abnormal widening of the pubic symphysis to 3.5 cm. Disruption of the right ilioischial line, concerning for fracture. Femoral heads in gross alignment with the acetabula. Femoral head heights preserved. Osteoarthritic changes about the hips. Remainder the visualized bony pelvis grossly intact, although evaluation limited due to body habitus. Anastomotic suture line overlies the pelvis. IMPRESSION: 1. Abnormal widening of the pubis symphysis to 3.5 cm with disruption of the right ilioischial line, concerning for fracture. 2. 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 Electronically Signed   By: Rise Mu M.D.   On: 05/21/2017 01:45   Dg Knee Complete 4 Views Left  Result Date: 05/21/2017 CLINICAL DATA:  Initial evaluation for acute trauma, fall. EXAM: LEFT KNEE - COMPLETE 4+ VIEW COMPARISON:  None. FINDINGS: No acute fracture or  dislocation. No joint effusion. Advanced osteoarthritic changes. Osseous mineralization normal. No soft tissue abnormality. Vascular calcifications noted. IMPRESSION: 1. No acute osseus abnormality about the left knee. 2. Advanced tricompartmental degenerative osteoarthrosis. Electronically Signed   By: Rise Mu M.D.   On: 05/21/2017 01:47   Dg Knee Complete 4 Views Right  Result Date: 05/21/2017 CLINICAL DATA:  Initial evaluation for acute trauma, fall. EXAM: RIGHT KNEE - COMPLETE 4+ VIEW COMPARISON:  None. FINDINGS: No acute fracture or dislocation. No joint effusion. Advanced osteoarthrosis. Osseous mineralization within normal limits. No soft tissue abnormality. Vascular calcifications noted. IMPRESSION: 1. No acute fracture or dislocation about the right knee. 2. Advanced degenerative tricompartmental osteoarthrosis. Electronically Signed   By: Rise Mu M.D.   On: 05/21/2017 01:48    Procedures Procedures (including critical care time)  Medications Ordered in ED Medications - No data to display   Initial  Impression / Assessment and Plan / ED Course  I have reviewed the triage vital signs and the nursing notes.  Pertinent labs & imaging results that were available during my care of the patient were reviewed by me and considered in my medical decision making (see chart for details).     Patient presents with knee pain and hip pain following a fall on Sunday. She is fairly difficult to examine given her body habitus. However, no acute deformities noted. She denies other symptoms. She has baseline lower extremity swelling without evidence of cellulitis. X-rays obtained. She has degenerative changes of bilateral knees. She does have some evidence of widening of her pubic symphysis and disruption of the right ilioischial line, concerning for possible fracture. CT scan was ordered. However, patient adamantly refused CT. I discussed with her the importance of this finding  possible fracture of her pelvis. She reports that in no way does she want a CT scan. She understands the risk and the benefits and is able to verbalize these back to me. She appears to have capacity to make decisions. Will allow her to rest given that she is homeless. Will ensure that she is at her baseline prior to discharge.  6:03 AM Patient requesting discharge. Patient reports that she knows that she has had a pelvic fracture in the past. It is unclear whether this pelvic fracture on x-ray or new or old. She is at her baseline and can transfer to her chair. She reports that she will follow-up with her orthopedist.  After history, exam, and medical workup I feel the patient has been appropriately medically screened and is safe for discharge home. Pertinent diagnoses were discussed with the patient. Patient was given return precautions.   Final Clinical Impressions(s) / ED Diagnoses   Final diagnoses:  Hip pain  Other closed fracture of right ilium, initial encounter (HCC)  Chronic pain of both knees    New Prescriptions Discharge Medication List as of 05/21/2017  5:38 AM       Wilkie AyeHorton, Mayer Maskerourtney F, MD 05/21/17 [id number]

## 2017-05-21 NOTE — ED Notes (Signed)
Patient transported to X-ray 

## 2017-05-21 NOTE — Discharge Instructions (Signed)
You were seen today for bilateral knee pain and hip pain. You have a pelvic fracture on x-ray but this is unknown whether it is acute or chronic. You would not allow for CT scanning to further evaluate. Follow-up with orthopedics.

## 2017-05-21 NOTE — ED Notes (Signed)
Patient got up to chair, cleaned herself up and changed into clean clothes with minimal assist. Patient now waiting for wheelchair to charge.

## 2017-05-22 DIAGNOSIS — M19072 Primary osteoarthritis, left ankle and foot: Secondary | ICD-10-CM | POA: Diagnosis not present

## 2017-05-22 DIAGNOSIS — M25552 Pain in left hip: Secondary | ICD-10-CM | POA: Diagnosis not present

## 2017-05-22 DIAGNOSIS — M25551 Pain in right hip: Secondary | ICD-10-CM | POA: Diagnosis not present

## 2017-05-22 DIAGNOSIS — M170 Bilateral primary osteoarthritis of knee: Secondary | ICD-10-CM | POA: Diagnosis not present

## 2017-05-22 DIAGNOSIS — M898X6 Other specified disorders of bone, lower leg: Secondary | ICD-10-CM | POA: Diagnosis not present

## 2017-05-22 DIAGNOSIS — I998 Other disorder of circulatory system: Secondary | ICD-10-CM | POA: Diagnosis not present

## 2017-05-22 DIAGNOSIS — S92102D Unspecified fracture of left talus, subsequent encounter for fracture with routine healing: Secondary | ICD-10-CM | POA: Diagnosis not present

## 2017-05-25 DIAGNOSIS — Z79899 Other long term (current) drug therapy: Secondary | ICD-10-CM | POA: Diagnosis not present

## 2017-05-25 DIAGNOSIS — Y998 Other external cause status: Secondary | ICD-10-CM | POA: Insufficient documentation

## 2017-05-25 DIAGNOSIS — Y939 Activity, unspecified: Secondary | ICD-10-CM | POA: Diagnosis not present

## 2017-05-25 DIAGNOSIS — I10 Essential (primary) hypertension: Secondary | ICD-10-CM | POA: Diagnosis not present

## 2017-05-25 DIAGNOSIS — S80811A Abrasion, right lower leg, initial encounter: Secondary | ICD-10-CM | POA: Diagnosis not present

## 2017-05-25 DIAGNOSIS — R2240 Localized swelling, mass and lump, unspecified lower limb: Secondary | ICD-10-CM | POA: Insufficient documentation

## 2017-05-25 DIAGNOSIS — M7989 Other specified soft tissue disorders: Secondary | ICD-10-CM | POA: Diagnosis not present

## 2017-05-25 DIAGNOSIS — Z590 Homelessness: Secondary | ICD-10-CM | POA: Insufficient documentation

## 2017-05-25 DIAGNOSIS — Y92410 Unspecified street and highway as the place of occurrence of the external cause: Secondary | ICD-10-CM | POA: Diagnosis not present

## 2017-05-25 DIAGNOSIS — Z87891 Personal history of nicotine dependence: Secondary | ICD-10-CM | POA: Insufficient documentation

## 2017-05-25 DIAGNOSIS — S80812A Abrasion, left lower leg, initial encounter: Secondary | ICD-10-CM | POA: Insufficient documentation

## 2017-05-25 DIAGNOSIS — M79606 Pain in leg, unspecified: Secondary | ICD-10-CM | POA: Diagnosis present

## 2017-05-25 DIAGNOSIS — X58XXXA Exposure to other specified factors, initial encounter: Secondary | ICD-10-CM | POA: Diagnosis not present

## 2017-05-25 DIAGNOSIS — S99912A Unspecified injury of left ankle, initial encounter: Secondary | ICD-10-CM | POA: Diagnosis not present

## 2017-05-25 DIAGNOSIS — R600 Localized edema: Secondary | ICD-10-CM | POA: Diagnosis not present

## 2017-05-26 ENCOUNTER — Emergency Department (HOSPITAL_COMMUNITY): Payer: Medicare Other

## 2017-05-26 ENCOUNTER — Encounter (HOSPITAL_COMMUNITY): Payer: Self-pay | Admitting: Emergency Medicine

## 2017-05-26 ENCOUNTER — Emergency Department (HOSPITAL_COMMUNITY)
Admission: EM | Admit: 2017-05-26 | Discharge: 2017-05-26 | Disposition: A | Discharge status: Home / Self Care | Payer: Medicare Other | Attending: Emergency Medicine | Admitting: Emergency Medicine

## 2017-05-26 DIAGNOSIS — S80812A Abrasion, left lower leg, initial encounter: Secondary | ICD-10-CM

## 2017-05-26 DIAGNOSIS — M7989 Other specified soft tissue disorders: Secondary | ICD-10-CM | POA: Diagnosis not present

## 2017-05-26 DIAGNOSIS — S99912A Unspecified injury of left ankle, initial encounter: Secondary | ICD-10-CM | POA: Diagnosis not present

## 2017-05-26 DIAGNOSIS — R600 Localized edema: Secondary | ICD-10-CM

## 2017-05-26 DIAGNOSIS — S80811A Abrasion, right lower leg, initial encounter: Secondary | ICD-10-CM

## 2017-05-26 LAB — CBC WITH DIFFERENTIAL/PLATELET
BASOS PCT: 0 %
Basophils Absolute: 0 10*3/uL (ref 0.0–0.1)
Eosinophils Absolute: 0.2 10*3/uL (ref 0.0–0.7)
Eosinophils Relative: 5 %
HEMATOCRIT: 35.3 % — AB (ref 36.0–46.0)
HEMOGLOBIN: 10.9 g/dL — AB (ref 12.0–15.0)
LYMPHS ABS: 1.8 10*3/uL (ref 0.7–4.0)
Lymphocytes Relative: 38 %
MCH: 24.8 pg — AB (ref 26.0–34.0)
MCHC: 30.9 g/dL (ref 30.0–36.0)
MCV: 80.4 fL (ref 78.0–100.0)
MONO ABS: 0.4 10*3/uL (ref 0.1–1.0)
MONOS PCT: 9 %
NEUTROS ABS: 2.3 10*3/uL (ref 1.7–7.7)
NEUTROS PCT: 48 %
Platelets: 245 10*3/uL (ref 150–400)
RBC: 4.39 MIL/uL (ref 3.87–5.11)
RDW: 17 % — AB (ref 11.5–15.5)
WBC: 4.8 10*3/uL (ref 4.0–10.5)

## 2017-05-26 LAB — BASIC METABOLIC PANEL
Anion gap: 7 (ref 5–15)
BUN: 15 mg/dL (ref 6–20)
CALCIUM: 8.7 mg/dL — AB (ref 8.9–10.3)
CHLORIDE: 104 mmol/L (ref 101–111)
CO2: 28 mmol/L (ref 22–32)
CREATININE: 0.98 mg/dL (ref 0.44–1.00)
GFR calc Af Amer: >60 mL/min (ref >60)
GFR calc non Af Amer: 55 mL/min — ABNORMAL LOW (ref >60)
Glucose, Bld: 97 mg/dL (ref 65–99)
Potassium: 4.6 mmol/L (ref 3.5–5.1)
Sodium: 139 mmol/L (ref 135–145)

## 2017-05-26 MED ORDER — BACITRACIN ZINC 500 UNIT/GM EX OINT
TOPICAL_OINTMENT | Freq: Every day | CUTANEOUS | Status: DC
  Administered 2017-05-26: 1 via TOPICAL

## 2017-05-26 MED ORDER — FUROSEMIDE 20 MG PO TABS: 20.0000 mg | ORAL_TABLET | Freq: Every day | ORAL | 0 refills | Status: DC

## 2017-05-26 MED ORDER — FUROSEMIDE 20 MG PO TABS
40.0000 mg | ORAL_TABLET | Freq: Once | ORAL | Status: AC
  Administered 2017-05-26: 40 mg via ORAL
  Filled 2017-05-26: qty 2
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
## 2017-05-26 NOTE — ED Notes (Signed)
Patient transported to X-ray 

## 2017-05-26 NOTE — ED Triage Notes (Signed)
Pt reported "some scratches" to her legs. Legs are red and swollen, several sores are weeping.
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
## 2017-05-29 DIAGNOSIS — M79672 Pain in left foot: Secondary | ICD-10-CM | POA: Diagnosis not present

## 2017-05-29 DIAGNOSIS — R600 Localized edema: Secondary | ICD-10-CM | POA: Diagnosis not present

## 2017-05-29 DIAGNOSIS — S80811A Abrasion, right lower leg, initial encounter: Secondary | ICD-10-CM | POA: Diagnosis not present

## 2017-05-29 DIAGNOSIS — R05 Cough: Secondary | ICD-10-CM | POA: Diagnosis not present

## 2017-05-29 DIAGNOSIS — S80812A Abrasion, left lower leg, initial encounter: Secondary | ICD-10-CM | POA: Diagnosis not present

## 2017-05-30 DIAGNOSIS — M7989 Other specified soft tissue disorders: Secondary | ICD-10-CM | POA: Diagnosis not present

## 2017-05-30 DIAGNOSIS — M79672 Pain in left foot: Secondary | ICD-10-CM | POA: Diagnosis not present

## 2017-05-31 ENCOUNTER — Emergency Department (HOSPITAL_COMMUNITY)
Admission: EM | Admit: 2017-05-31 | Discharge: 2017-06-01 | Disposition: A | Discharge status: Home / Self Care | Payer: Medicare Other | Attending: Emergency Medicine | Admitting: Emergency Medicine

## 2017-05-31 ENCOUNTER — Telehealth (HOSPITAL_COMMUNITY): Payer: Self-pay | Admitting: Emergency Medicine

## 2017-05-31 ENCOUNTER — Encounter (HOSPITAL_COMMUNITY): Payer: Self-pay | Admitting: Emergency Medicine

## 2017-05-31 ENCOUNTER — Ambulatory Visit (HOSPITAL_COMMUNITY)
Admission: EM | Admit: 2017-05-31 | Discharge: 2017-05-31 | Disposition: A | Discharge status: Home / Self Care | Payer: Medicare Other | Attending: Family Medicine | Admitting: Family Medicine

## 2017-05-31 ENCOUNTER — Encounter (HOSPITAL_COMMUNITY): Payer: Self-pay | Admitting: Nurse Practitioner

## 2017-05-31 DIAGNOSIS — Z5900 Homelessness unspecified: Secondary | ICD-10-CM

## 2017-05-31 DIAGNOSIS — I10 Essential (primary) hypertension: Secondary | ICD-10-CM | POA: Insufficient documentation

## 2017-05-31 DIAGNOSIS — L97901 Non-pressure chronic ulcer of unspecified part of unspecified lower leg limited to breakdown of skin: Secondary | ICD-10-CM

## 2017-05-31 DIAGNOSIS — Z87891 Personal history of nicotine dependence: Secondary | ICD-10-CM | POA: Diagnosis not present

## 2017-05-31 DIAGNOSIS — M79606 Pain in leg, unspecified: Secondary | ICD-10-CM | POA: Diagnosis not present

## 2017-05-31 DIAGNOSIS — L97911 Non-pressure chronic ulcer of unspecified part of right lower leg limited to breakdown of skin: Secondary | ICD-10-CM | POA: Diagnosis not present

## 2017-05-31 DIAGNOSIS — T07XXXA Unspecified multiple injuries, initial encounter: Secondary | ICD-10-CM

## 2017-05-31 DIAGNOSIS — L97921 Non-pressure chronic ulcer of unspecified part of left lower leg limited to breakdown of skin: Secondary | ICD-10-CM | POA: Diagnosis not present

## 2017-05-31 DIAGNOSIS — Z79899 Other long term (current) drug therapy: Secondary | ICD-10-CM | POA: Insufficient documentation

## 2017-05-31 MED ORDER — MUPIROCIN 2 % EX OINT: 1.0000 "application " | TOPICAL_OINTMENT | Freq: Three times a day (TID) | CUTANEOUS | 1 refills | Status: DC
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
## 2017-05-31 NOTE — ED Triage Notes (Signed)
Pt was brought in by EMS, reportedely called [facility] where she had been denied entry due to her bilateral wound dressing. She requested medics to be transported to the hospital. C/o bilateral wounds leg pain.

## 2017-05-31 NOTE — Telephone Encounter (Signed)
The patient requested that [facility].

## 2017-05-31 NOTE — ED Triage Notes (Signed)
Pt reported "some scratches" to her legs. Legs are red and swollen, several sores are weeping. 

## 2017-05-31 NOTE — Discharge Instructions (Addendum)
Please have the dressings changed every day using the new antibacterial cream and washing the areas with soap and water.  I recommend you get the Lasix prescription filled to keep fluid from accumulating in your lower legs.
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
## 2017-06-01 MED ORDER — BACITRACIN ZINC 500 UNIT/GM EX OINT
TOPICAL_OINTMENT | Freq: Once | CUTANEOUS | Status: AC
Start: 2017-06-01 — End: 2017-06-01
  Administered 2017-06-01: 1 via TOPICAL

## 2017-06-01 MED ORDER — BACITRACIN ZINC 500 UNIT/GM EX OINT
TOPICAL_OINTMENT | CUTANEOUS | Status: AC
  Administered 2017-06-01: 1 via TOPICAL
  Filled 2017-06-01: qty 0.9

## 2017-06-01 NOTE — ED Notes (Signed)
Pt has been discharged but report was received from night shift RN that pt needs social work consult. Social work has been called and report that they will go and speak with pt and call this charge RN back.

## 2017-06-01 NOTE — Discharge Instructions (Signed)
We saw you in the ER for your WOUND. Please read the instructions provided on wound care. Keep the area clean and dry. RETURN TO THE ER IF THERE IS INCREASED PAIN, REDNESS, PUS COMING OUT from the wound site.  Your wound is not infected and as long as it is covered shouldn't be sanitary hazard for any shelter.

## 2017-06-01 NOTE — ED Notes (Signed)
Pt refused to sign discharge 

## 2017-06-01 NOTE — ED Notes (Signed)
Pt want to wait in the lobby for social work.
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
## 2017-06-01 NOTE — ED Notes (Signed)
Pt asleep, unlabored respirations noted.  

## 2017-06-03 ENCOUNTER — Ambulatory Visit: Payer: Medicare Other | Attending: Internal Medicine | Admitting: Internal Medicine

## 2017-06-03 ENCOUNTER — Telehealth: Payer: Self-pay

## 2017-06-03 VITALS — BP 143/74 | HR 65 | Resp 18 | Ht 62.0 in | Wt 278.0 lb

## 2017-06-03 DIAGNOSIS — R600 Localized edema: Secondary | ICD-10-CM

## 2017-06-03 DIAGNOSIS — I10 Essential (primary) hypertension: Secondary | ICD-10-CM | POA: Insufficient documentation

## 2017-06-03 DIAGNOSIS — F419 Anxiety disorder, unspecified: Secondary | ICD-10-CM | POA: Insufficient documentation

## 2017-06-03 DIAGNOSIS — F329 Major depressive disorder, single episode, unspecified: Secondary | ICD-10-CM | POA: Insufficient documentation

## 2017-06-03 DIAGNOSIS — M7989 Other specified soft tissue disorders: Secondary | ICD-10-CM | POA: Diagnosis not present

## 2017-06-03 DIAGNOSIS — Z4800 Encounter for change or removal of nonsurgical wound dressing: Secondary | ICD-10-CM | POA: Diagnosis not present

## 2017-06-03 DIAGNOSIS — S80812A Abrasion, left lower leg, initial encounter: Secondary | ICD-10-CM | POA: Diagnosis not present

## 2017-06-03 DIAGNOSIS — Z993 Dependence on wheelchair: Secondary | ICD-10-CM | POA: Insufficient documentation

## 2017-06-03 DIAGNOSIS — Z5900 Homelessness unspecified: Secondary | ICD-10-CM

## 2017-06-03 DIAGNOSIS — S80811A Abrasion, right lower leg, initial encounter: Secondary | ICD-10-CM | POA: Insufficient documentation

## 2017-06-03 DIAGNOSIS — T07XXXA Unspecified multiple injuries, initial encounter: Secondary | ICD-10-CM

## 2017-06-03 MED ORDER — ALBUTEROL SULFATE HFA 108 (90 BASE) MCG/ACT IN AERS: 2.0000 | INHALATION_SPRAY | RESPIRATORY_TRACT | 0 refills | Status: DC | PRN

## 2017-06-03 MED ORDER — DOCUSATE SODIUM 100 MG PO CAPS: 100.0000 mg | ORAL_CAPSULE | Freq: Two times a day (BID) | ORAL | 1 refills | Status: DC

## 2017-06-03 MED ORDER — MUPIROCIN 2 % EX OINT: 1.0000 "application " | TOPICAL_OINTMENT | Freq: Three times a day (TID) | CUTANEOUS | 1 refills | Status: DC

## 2017-06-03 MED ORDER — FUROSEMIDE 20 MG PO TABS: 20.0000 mg | ORAL_TABLET | Freq: Every day | ORAL | 0 refills | Status: DC

## 2017-06-03 MED ORDER — DIPHENHYDRAMINE HCL 25 MG PO CAPS: 25.0000 mg | ORAL_CAPSULE | Freq: Four times a day (QID) | ORAL | 0 refills | Status: DC | PRN

## 2017-06-03 MED FILL — FUROSEMIDE 20 MG TABLET: 20 | 10 days supply | Qty: 10 | Fill #0

## 2017-06-03 MED FILL — VENTOLIN HFA 90 MCG INHALER: 108 (90 BAS | 16 days supply | Qty: 1 | Fill #0

## 2017-06-03 MED FILL — MUPIROCIN 2% OINTMENT: 2 | 7 days supply | Qty: 22 | Fill #0

## 2017-06-03 NOTE — Telephone Encounter (Signed)
At the request of [place], met with the patient when she was in the clinic today. She stated that she has been working with the [facility] to obtain permanent housing and in the meantime they have been assisting her with arranging short term stays at [employer]. She has an income of $1300/month and in interested in having her own apartment.  Encouraged her to continue to work with the [facility] and she said that she would go to the [facility] after leaving the clinic.  She noted that she has stayed at the [facility] in the past; but does not think that she will be welcome to stay there any longer. She explained that she had been staying with a daughter in [place] but she wants to relocate to [place].   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  Provided her with the [facility] contact # [phone] and also encouraged her to call regarding housing as well as the aides and attendance program.
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
## 2017-06-03 NOTE — Patient Instructions (Signed)

## 2017-06-09 ENCOUNTER — Encounter (HOSPITAL_COMMUNITY): Payer: Self-pay

## 2017-06-09 DIAGNOSIS — R079 Chest pain, unspecified: Secondary | ICD-10-CM | POA: Diagnosis not present

## 2017-06-09 DIAGNOSIS — R0602 Shortness of breath: Secondary | ICD-10-CM | POA: Insufficient documentation

## 2017-06-09 DIAGNOSIS — Z79899 Other long term (current) drug therapy: Secondary | ICD-10-CM | POA: Insufficient documentation

## 2017-06-09 DIAGNOSIS — I10 Essential (primary) hypertension: Secondary | ICD-10-CM | POA: Diagnosis not present

## 2017-06-09 DIAGNOSIS — Z87891 Personal history of nicotine dependence: Secondary | ICD-10-CM | POA: Insufficient documentation

## 2017-06-09 DIAGNOSIS — R2243 Localized swelling, mass and lump, lower limb, bilateral: Secondary | ICD-10-CM | POA: Insufficient documentation

## 2017-06-09 DIAGNOSIS — R600 Localized edema: Secondary | ICD-10-CM | POA: Diagnosis not present
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
## 2017-06-10 ENCOUNTER — Emergency Department (HOSPITAL_COMMUNITY)
Admission: EM | Admit: 2017-06-10 | Discharge: 2017-06-10 | Disposition: A | Discharge status: Home / Self Care | Payer: Medicare Other | Attending: Emergency Medicine | Admitting: Emergency Medicine

## 2017-06-10 ENCOUNTER — Emergency Department (HOSPITAL_COMMUNITY): Payer: Medicare Other

## 2017-06-10 ENCOUNTER — Ambulatory Visit (HOSPITAL_BASED_OUTPATIENT_CLINIC_OR_DEPARTMENT_OTHER): Payer: Medicare Other | Admitting: *Deleted

## 2017-06-10 VITALS — BP 132/90 | HR 70 | Temp 98.6°F | Resp 20

## 2017-06-10 DIAGNOSIS — T07XXXA Unspecified multiple injuries, initial encounter: Secondary | ICD-10-CM

## 2017-06-10 DIAGNOSIS — R079 Chest pain, unspecified: Secondary | ICD-10-CM | POA: Diagnosis not present

## 2017-06-10 DIAGNOSIS — R600 Localized edema: Secondary | ICD-10-CM

## 2017-06-10 DIAGNOSIS — Z4800 Encounter for change or removal of nonsurgical wound dressing: Secondary | ICD-10-CM

## 2017-06-10 DIAGNOSIS — R0602 Shortness of breath: Secondary | ICD-10-CM

## 2017-06-10 LAB — CBC WITH DIFFERENTIAL/PLATELET
Basophils Absolute: 0 10*3/uL (ref 0.0–0.1)
Basophils Relative: 0 %
EOS ABS: 0.3 10*3/uL (ref 0.0–0.7)
Eosinophils Relative: 5 %
HCT: 33.4 % — ABNORMAL LOW (ref 36.0–46.0)
HEMOGLOBIN: 10.5 g/dL — AB (ref 12.0–15.0)
LYMPHS ABS: 1.7 10*3/uL (ref 0.7–4.0)
Lymphocytes Relative: 34 %
MCH: 25.3 pg — AB (ref 26.0–34.0)
MCHC: 31.4 g/dL (ref 30.0–36.0)
MCV: 80.5 fL (ref 78.0–100.0)
Monocytes Absolute: 0.4 10*3/uL (ref 0.1–1.0)
Monocytes Relative: 7 %
NEUTROS ABS: 2.8 10*3/uL (ref 1.7–7.7)
NEUTROS PCT: 54 %
Platelets: 239 10*3/uL (ref 150–400)
RBC: 4.15 MIL/uL (ref 3.87–5.11)
RDW: 16.4 % — ABNORMAL HIGH (ref 11.5–15.5)
WBC: 5.1 10*3/uL (ref 4.0–10.5)

## 2017-06-10 LAB — BASIC METABOLIC PANEL
Anion gap: 6 (ref 5–15)
BUN: 9 mg/dL (ref 6–20)
CHLORIDE: 109 mmol/L (ref 101–111)
CO2: 27 mmol/L (ref 22–32)
Calcium: 8.7 mg/dL — ABNORMAL LOW (ref 8.9–10.3)
Creatinine, Ser: 1.06 mg/dL — ABNORMAL HIGH (ref 0.44–1.00)
GFR calc non Af Amer: 50 mL/min — ABNORMAL LOW (ref >60)
GFR, EST AFRICAN AMERICAN: 58 mL/min — AB (ref >60)
Glucose, Bld: 99 mg/dL (ref 65–99)
POTASSIUM: 4.6 mmol/L (ref 3.5–5.1)
SODIUM: 142 mmol/L (ref 135–145)

## 2017-06-10 LAB — TROPONIN I

## 2017-06-10 LAB — I-STAT TROPONIN, ED: TROPONIN I, POC: 0 ng/mL (ref 0.00–0.08)

## 2017-06-10 LAB — BRAIN NATRIURETIC PEPTIDE: B NATRIURETIC PEPTIDE 5: 70.8 pg/mL (ref 0.0–100.0)

## 2017-06-10 MED ORDER — FUROSEMIDE 10 MG/ML IJ SOLN
40.0000 mg | Freq: Once | INTRAMUSCULAR | Status: DC
  Filled 2017-06-10: qty 4

## 2017-06-10 MED ORDER — FUROSEMIDE 10 MG/ML IJ SOLN
40.0000 mg | Freq: Once | INTRAMUSCULAR | Status: AC
  Administered 2017-06-10: 40 mg via INTRAMUSCULAR

## 2017-06-10 MED ORDER — FUROSEMIDE 20 MG PO TABS: 20.0000 mg | ORAL_TABLET | Freq: Every day | ORAL | 0 refills | Status: DC
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
## 2017-06-10 NOTE — ED Notes (Signed)
Placed dressing on pt's bilateral lower legs
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
## 2017-06-10 NOTE — Discharge Instructions (Signed)
You were seen today for shortness of breath and chest pain. You continue to have lower leg swelling. You need to be started on Lasix. Follow-up with your primary physician.

## 2017-06-12 ENCOUNTER — Ambulatory Visit: Payer: Medicare Other | Attending: Family Medicine | Admitting: Licensed Clinical Social Worker

## 2017-06-12 ENCOUNTER — Ambulatory Visit: Payer: Medicare Other | Attending: Family Medicine | Admitting: Family Medicine

## 2017-06-12 VITALS — BP 138/77 | HR 77 | Temp 98.3°F | Resp 18 | Ht 62.0 in | Wt 274.4 lb

## 2017-06-12 DIAGNOSIS — Z6841 Body Mass Index (BMI) 40.0 and over, adult: Secondary | ICD-10-CM | POA: Insufficient documentation

## 2017-06-12 DIAGNOSIS — E669 Obesity, unspecified: Secondary | ICD-10-CM | POA: Insufficient documentation

## 2017-06-12 DIAGNOSIS — L03119 Cellulitis of unspecified part of limb: Secondary | ICD-10-CM | POA: Diagnosis not present

## 2017-06-12 DIAGNOSIS — I10 Essential (primary) hypertension: Secondary | ICD-10-CM | POA: Insufficient documentation

## 2017-06-12 DIAGNOSIS — Z5900 Homelessness unspecified: Secondary | ICD-10-CM

## 2017-06-12 DIAGNOSIS — Z79899 Other long term (current) drug therapy: Secondary | ICD-10-CM | POA: Diagnosis not present

## 2017-06-12 MED ORDER — AMLODIPINE BESYLATE 2.5 MG PO TABS: 2.5000 mg | ORAL_TABLET | Freq: Every day | ORAL | 2 refills | Status: DC

## 2017-06-12 MED ORDER — CEPHALEXIN 500 MG PO CAPS: 500.0000 mg | ORAL_CAPSULE | Freq: Four times a day (QID) | ORAL | 0 refills | Status: DC

## 2017-06-12 MED FILL — AMLODIPINE BESYLATE 2.5 MG: 2.5 | 30 days supply | Qty: 30 | Fill #0

## 2017-06-12 MED FILL — CEPHALEXIN 500 MG CAPSULE: 500 | 5 days supply | Qty: 20 | Fill #0

## 2017-06-12 NOTE — Patient Instructions (Signed)

## 2017-06-12 NOTE — Progress Notes (Signed)
Subjective:  Patient ID: Beth Hall, female    DOB: 01-Aug-1941  Age: 76 y.o. MRN: [id number]  CC: Establish Care   HPI DASHANTI BURR presents to establish care. History of HTN. She is not exercising and is not adherent to low salt diet.  She does not check BP. Cardiac symptoms none. Patient denies chest pain, chest pressure/discomfort, near-syncope and syncope.  Cardiovascular risk factors: advanced age (older than 42 for men, 42 for women), hypertension, obesity (BMI >= 30 kg/m2) and sedentary lifestyle. Use of agents associated with hypertension: none. History of target organ damage: none. History of abrasion to BLE. Associated symptoms include swelling. Gauze dressings in place.   Outpatient Medications Prior to Visit  Medication Sig Dispense Refill  . acetaminophen (TYLENOL) 500 MG tablet Take 1 tablet (500 mg total) by mouth every 6 (six) hours as needed. (Patient taking differently: Take 500 mg by mouth every 6 (six) hours as needed. ) 30 tablet 0  . albuterol (PROVENTIL HFA;VENTOLIN HFA) 108 (90 Base) MCG/ACT inhaler Inhale 2 puffs into the lungs every 4 (four) hours as needed for wheezing or shortness of breath. (Patient not taking: Reported on 06/10/2017) 1 Inhaler 0  . diphenhydrAMINE (BENADRYL) 25 mg capsule Take 1 capsule (25 mg total) by mouth every 6 (six) hours as needed for itching. (Patient not taking: Reported on 06/10/2017) 30 capsule 0  . docusate sodium (COLACE) 100 MG capsule Take 1 capsule (100 mg total) by mouth every 12 (twelve) hours. 60 capsule 1  . furosemide (LASIX) 20 MG tablet Take 1 tablet (20 mg total) by mouth daily. (Patient not taking: Reported on 06/10/2017) 10 tablet 0  . furosemide (LASIX) 20 MG tablet Take 1 tablet (20 mg total) by mouth daily. 14 tablet 0  . mupirocin ointment (BACTROBAN) 2 % Apply 1 application topically 3 (three) times daily. 30 g 1  . senna (SENOKOT) 8.6 MG tablet Take 8.6 mg by mouth every evening.    . triamcinolone cream  (KENALOG) 0.1 % Apply 1 application topically 4 (four) times daily as needed. (Patient taking differently: Apply 1 application topically 4 (four) times daily as needed (rash). ) 30 g 0   No facility-administered medications prior to visit.     ROS Review of Systems  Constitutional: Negative.   Eyes: Negative.   Respiratory: Negative.   Cardiovascular: Negative.   Gastrointestinal: Negative.   Musculoskeletal:       Wheelchair bound   Skin: Positive for wound.  Neurological: Negative.   Psychiatric/Behavioral: Positive for behavioral problems. Negative for suicidal ideas.    Objective:  BP 138/77 (BP Location: Left Arm, Patient Position: Sitting, Cuff Size: Normal)   Pulse 77   Temp 98.3 F (36.8 C) (Oral)   Resp 18   Ht 5\' 2"  (1.575 m)   Wt 274 lb 6.4 oz (124.5 kg)   SpO2 91%   BMI 50.19 kg/m   BP/Weight 06/21/2017 06/20/2017 06/12/2017  Systolic BP 171 - 138  Diastolic BP 95 - 77  Wt. (Lbs) - 274 274.4  BMI - 50.12 50.19    Physical Exam  Constitutional: She appears well-developed and well-nourished.  Eyes: Pupils are equal, round, and reactive to light. Conjunctivae are normal.  Neck: No JVD present.  Cardiovascular: Normal rate, regular rhythm, normal heart sounds and intact distal pulses.   Pulmonary/Chest: Effort normal and breath sounds normal.  Abdominal: Soft. Bowel sounds are normal. There is no tenderness.  Skin: Skin is warm and dry. Abrasion noted.  Several, shallow, abrasions to bilateral lower extremities. Dressings dry and intact, yellow exudate present.  Psychiatric: Her speech is rapid and/or pressured and tangential. She expresses no homicidal and no suicidal ideation. She expresses no suicidal plans and no homicidal plans.  Nursing note and vitals reviewed.   Assessment & Plan:   Problem List Items Addressed This Visit      Cardiovascular and Mediastinum   Essential hypertension   Relevant Medications   amLODipine (NORVASC) 2.5 MG tablet      Other Visit Diagnoses    Cellulitis of lower extremity, unspecified laterality    -  Primary   Relevant Medications   cephALEXin (KEFLEX) 500 MG capsule       -LCSW spoke with patient and provided resources.       Meds ordered this encounter  Medications  . cephALEXin (KEFLEX) 500 MG capsule    Sig: Take 1 capsule (500 mg total) by mouth 4 (four) times daily.    Dispense:  20 capsule    Refill:  0    Order Specific Question:   Supervising Provider    Answer:   Quentin Angst [id number]  . amLODipine (NORVASC) 2.5 MG tablet    Sig: Take 1 tablet (2.5 mg total) by mouth daily.    Dispense:  30 tablet    Refill:  2    Order Specific Question:   Supervising Provider    Answer:   Quentin Angst [id number]    Follow-up: Return in about 2 weeks (around 06/26/2017) for Wound Check/ HTN.   Lizbeth Bark FNP

## 2017-06-12 NOTE — BH Specialist Note (Signed)
Integrated Behavioral Health Initial Visit  MRN: [id number] Name: Beth Hall   Session Start time: 3:00 PM Session End time: 3:30 PM Total time: 30 minutes  Type of Service: Integrated Behavioral Health- Individual/Family Interpretor:No. Interpretor Name and Language: N/A   Warm Hand Off Completed.       SUBJECTIVE: Beth Hall is a 76 y.o. female accompanied by patient. Patient was referred by FNP [place] for depression. Patient reports the following symptoms/concerns: feelings of sadness, difficulty sleeping, low energy, difficulty concentrating, and homelessness Duration of problem: 1 month; Severity of problem: mild  OBJECTIVE: Mood: Pleasant and Affect: Appropriate Risk of harm to self or others: No plan to harm self or others   LIFE CONTEXT: Family and Social: Pt is currently homeless. She has been residing between the [employer] and [address].  School/Work: Pt receives [employer] [phone]) and Medicare.  Self-Care: Pt reports that she enjoys reading the bible and writing to cope with stressors Life Changes: Pt is homeless and has limited support  GOALS ADDRESSED: Patient will reduce symptoms of: anxiety and depression and increase knowledge and/or ability of: coping skills and also: Increase healthy adjustment to current life circumstances and Increase adequate support systems for patient/family   INTERVENTIONS: Solution-Focused Strategies, Supportive Counseling and Link to [employer]  Standardized Assessments completed: GAD-7 and PHQ 2&9  ASSESSMENT: Patient currently experiencing depression triggered by ongoing family conflict that has resulted in homelessness. She reports feelings of sadness, difficulty sleeping, low energy, and difficulty concentrating. Patient receives limited support noting she visited the [facility] to assist in services. She states Delice Bison with [employer] is assisting with obtaining housing. LCSWA provided pt with information  on the [employer] to further assist, in addition, to the [employer]. Patient is not interested in initiating psychotherapy or medication management at this time. LCSWA encouraged patient to follow up with the provided resources. She was provided with food and transportation resources.   PLAN: 1. Follow up with behavioral health clinician on : Pt was encouraged tocontact LCSWA if symptoms worsen or fail to improveto schedule behavioral appointments at [facility]. 2. Behavioral recommendations: LCSWA recommends that pt apply healthy coping skills discussed and utilize provided resources. Pt is encouraged to schedule follow up appointment with LCSWA 3. Referral(s): [employer] Resources:  Arts administrator, Photographer 4. "From scale of 1-10, how likely are you to follow plan?": 7/10  Bridgett Larsson, LCSW 06/16/17 2:03 PM

## 2017-06-12 NOTE — Progress Notes (Signed)
Patient is here for feet wound dressing   Patient complains leg pain

## 2017-06-20 ENCOUNTER — Emergency Department (HOSPITAL_COMMUNITY)
Admission: EM | Admit: 2017-06-20 | Discharge: 2017-06-21 | Disposition: A | Discharge status: Home / Self Care | Payer: Medicare Other | Attending: Emergency Medicine | Admitting: Emergency Medicine

## 2017-06-20 ENCOUNTER — Other Ambulatory Visit: Payer: Self-pay

## 2017-06-20 ENCOUNTER — Encounter (HOSPITAL_COMMUNITY): Payer: Self-pay | Admitting: Emergency Medicine

## 2017-06-20 DIAGNOSIS — I10 Essential (primary) hypertension: Secondary | ICD-10-CM | POA: Diagnosis not present

## 2017-06-20 DIAGNOSIS — Z590 Homelessness: Secondary | ICD-10-CM | POA: Diagnosis not present

## 2017-06-20 DIAGNOSIS — Z87891 Personal history of nicotine dependence: Secondary | ICD-10-CM | POA: Diagnosis not present

## 2017-06-20 DIAGNOSIS — R0602 Shortness of breath: Secondary | ICD-10-CM | POA: Diagnosis not present

## 2017-06-20 DIAGNOSIS — Z79899 Other long term (current) drug therapy: Secondary | ICD-10-CM | POA: Insufficient documentation

## 2017-06-20 DIAGNOSIS — R001 Bradycardia, unspecified: Secondary | ICD-10-CM | POA: Diagnosis not present

## 2017-06-20 NOTE — ED Provider Notes (Signed)
[facility] Provider Note   CSN: [id number] Arrival date & time: 06/20/17  2255     History   Chief Complaint Chief Complaint  Patient presents with  . Shortness of Breath    HPI Beth RUPARD is a 76 y.o. female.  HPI   76 year old obese female who is homeless with history of anxiety, depression, hypertension presenting for evaluation of shortness of breath. Pt sts she asked a cab to take her to her storage unit to get some of her belongings so she can go to [place] to her motel.  When pt request to bring several large items to the cab, the cab driver refused to transfer her and her belongings to [facility].  Pt then talk to a security guard that she needed a place to stay, and that with the increase exertion to move her belongings she is having a bit of shortness of breath (which is not unusual for her).  The security guard recommend pt to come to [facility] for help.  Another cab brought pt here.  She currently does not have any significant sob.  Denies fever, chills, chest pain, productive cough or wheezing.  Pt sts her primary reason to come today is to have a place to stay for the night until she can get her belonging tomorrow to go to [place].  No other complaints.    Past Medical History:  Diagnosis Date  . Hip fracture (HCC)   . Homeless   . Hypertension   . Morbid obesity (HCC)   . Pelvic fracture (HCC)   . Post-partum depression     Patient Active Problem List   Diagnosis Date Noted  . Pedal edema 06/03/2017  . Essential hypertension 06/03/2017  . Homelessness 06/03/2017  . Abrasions of multiple sites 06/03/2017    History reviewed. No pertinent surgical history.  OB History    No data available       Home Medications    Prior to Admission medications   Medication Sig Start Date End Date Taking? Authorizing Provider  acetaminophen (TYLENOL) 500 MG tablet Take 1 tablet (500 mg total) by mouth every 6 (six) hours as needed. Patient taking  differently: Take 500 mg by mouth every 6 (six) hours as needed.  05/05/17   Horton, Mayer Masker, MD  albuterol (PROVENTIL HFA;VENTOLIN HFA) 108 (90 Base) MCG/ACT inhaler Inhale 2 puffs into the lungs every 4 (four) hours as needed for wheezing or shortness of breath. Patient not taking: Reported on 06/10/2017 06/03/17   Quentin Angst, MD  amLODipine (NORVASC) 2.5 MG tablet Take 1 tablet (2.5 mg total) by mouth daily. 06/12/17   Lizbeth Bark, FNP  cephALEXin (KEFLEX) 500 MG capsule Take 1 capsule (500 mg total) by mouth 4 (four) times daily. 06/12/17   Lizbeth Bark, FNP  diphenhydrAMINE (BENADRYL) 25 mg capsule Take 1 capsule (25 mg total) by mouth every 6 (six) hours as needed for itching. Patient not taking: Reported on 06/10/2017 06/03/17   Quentin Angst, MD  docusate sodium (COLACE) 100 MG capsule Take 1 capsule (100 mg total) by mouth every 12 (twelve) hours. 06/03/17   Quentin Angst, MD  furosemide (LASIX) 20 MG tablet Take 1 tablet (20 mg total) by mouth daily. Patient not taking: Reported on 06/10/2017 06/03/17   Quentin Angst, MD  furosemide (LASIX) 20 MG tablet Take 1 tablet (20 mg total) by mouth daily. 06/10/17   Horton, Mayer Masker, MD  mupirocin ointment (BACTROBAN) 2 %  Apply 1 application topically 3 (three) times daily. 06/03/17   Quentin AngstJegede, Olugbemiga E, MD  senna (SENOKOT) 8.6 MG tablet Take 8.6 mg by mouth every evening. 09/19/16   [provider]  triamcinolone cream (KENALOG) 0.1 % Apply 1 application topically 4 (four) times daily as needed. Patient taking differently: Apply 1 application topically 4 (four) times daily as needed (rash).  05/01/17   Pricilla LovelessGoldston, Scott, MD    Family History No family history on file.  Social History Social History  Substance Use Topics  . Smoking status: Former Smoker    Quit date: 11/09/2013  . Smokeless tobacco: Never Used  . Alcohol use No     Comment: OCCASIONAL     Allergies   Diazepam and Haldol  [haloperidol lactate]   Review of Systems Review of Systems  All other systems reviewed and are negative.    Physical Exam Updated Vital Signs BP (!) 152/70 (BP Location: Left Arm)   Pulse 61   Temp 98.3 F (36.8 C) (Oral)   Resp 18   Ht 5\' 2"  (1.575 m)   Wt 124.3 kg (274 lb)   SpO2 96%   BMI 50.12 kg/m   Physical Exam  Constitutional: She is oriented to person, place, and time. She appears well-developed and well-nourished. No distress.  obese female resting, in no acute discomfort  HENT:  Head: Atraumatic.  Eyes: Conjunctivae are normal.  Neck: Neck supple.  Cardiovascular: Normal rate and regular rhythm.   Pulmonary/Chest: Effort normal.  Able to speak in complete sentences no wheezes, rales, or rhonchi  Abdominal: Soft. Bowel sounds are normal.  Musculoskeletal: She exhibits edema (trace edema to bilateral lower extremities).  Neurological: She is alert and oriented to person, place, and time.  Skin: No rash noted.  Psychiatric: She has a normal mood and affect.  Nursing note and vitals reviewed.    ED Treatments / Results  Labs (all labs ordered are listed, but only abnormal results are displayed) Labs Reviewed - No data to display  EKG  EKG Interpretation  Date/Time:  Saturday June 20 2017 23:01:49 EDT Ventricular Rate:  59 PR Interval:  134 QRS Duration: 78 QT Interval:  462 QTC Calculation: 457 R Axis:   48 Text Interpretation:  Sinus bradycardia with sinus arrhythmia Nonspecific T wave abnormality Abnormal ECG No significant change since last tracing Confirmed by Drema Pryardama, Pedro [id number]) on 06/20/2017 11:19:09 PM       Radiology No results found.  Procedures Procedures (including critical care time)  Medications Ordered in ED Medications - No data to display   Initial Impression / Assessment and Plan / ED Course  I have reviewed the triage vital signs and the nursing notes.  Pertinent labs & imaging results that were available during  my care of the patient were reviewed by me and considered in my medical decision making (see chart for details).     BP (!) 171/86 (BP Location: Left Arm)   Pulse 79   Temp 98.3 F (36.8 C) (Oral)   Resp (!) 22   Ht 5\' 2"  (1.575 m)   Wt 124.3 kg (274 lb)   SpO2 100%   BMI 50.12 kg/m   The patient was noted to be hypertensive today in the emergency department. I have spoken with the patient regarding hypertension and the need for improved management. I instructed the patient to followup with the Primary care doctor within 4 days to improve the management of the patient's hypertension. I also counseled the patient  regarding the signs and symptoms which would require an emergent visit to an emergency department for hypertensive urgency and/or hypertensive emergency. The patient understood the need for improved hypertensive management.    Final Clinical Impressions(s) / ED Diagnoses   Final diagnoses:  Shortness of breath    New Prescriptions New Prescriptions   No medications on file   12:06 AM Pt's initial complaint is SOB.  However pt sts she just want to find a place to stay for the night and will seek shelter tomorrow since the cab that was suppose to take her to [place] refused to transfer her belongings (bedside commode, wheel chairs, bags, etc...).  Pt resting comfortably, no active SOB.  Has been seen in the ER multiple times for same.  Pt stable for discharge.    1:34 AM Pt ambulate while maintaining O2 at 98% on RA.  Care discussed with DR. Delo.    Fayrene Helper, PA-C 06/21/17 1610    Geoffery Lyons, MD 06/21/17 [phone]

## 2017-06-20 NOTE — ED Triage Notes (Signed)
Pt states she called a cab to take her to [place] to her storage unit then when she didn't have enough cash the cab driver dropped her off here. Pt states she has a money order she needs to have cashed. Pt now c/o shob. NAD, RR even and unlabored.  Pt requesting to sit in lobby until tomorrow.

## 2017-06-21 NOTE — ED Notes (Signed)
Pt ambulated with unsteady gait in room. O2 started at 98% prior to ambulation. During ambulation sats maintained at 98%.

## 2017-06-28 DIAGNOSIS — M545 Low back pain: Secondary | ICD-10-CM | POA: Diagnosis not present

## 2017-06-28 DIAGNOSIS — M1991 Primary osteoarthritis, unspecified site: Secondary | ICD-10-CM | POA: Diagnosis not present

## 2017-06-28 DIAGNOSIS — G8311 Monoplegia of lower limb affecting right dominant side: Secondary | ICD-10-CM | POA: Diagnosis not present

## 2017-06-28 DIAGNOSIS — M5135 Other intervertebral disc degeneration, thoracolumbar region: Secondary | ICD-10-CM | POA: Diagnosis not present

## 2017-06-28 DIAGNOSIS — R9431 Abnormal electrocardiogram [ECG] [EKG]: Secondary | ICD-10-CM | POA: Diagnosis not present

## 2017-06-28 DIAGNOSIS — M549 Dorsalgia, unspecified: Secondary | ICD-10-CM | POA: Diagnosis not present

## 2017-06-28 DIAGNOSIS — M546 Pain in thoracic spine: Secondary | ICD-10-CM | POA: Diagnosis not present

## 2017-06-28 DIAGNOSIS — F4310 Post-traumatic stress disorder, unspecified: Secondary | ICD-10-CM | POA: Diagnosis not present

## 2017-06-28 DIAGNOSIS — Z888 Allergy status to other drugs, medicaments and biological substances status: Secondary | ICD-10-CM | POA: Diagnosis not present

## 2017-06-28 DIAGNOSIS — I10 Essential (primary) hypertension: Secondary | ICD-10-CM | POA: Diagnosis not present

## 2017-06-28 DIAGNOSIS — Z9981 Dependence on supplemental oxygen: Secondary | ICD-10-CM | POA: Diagnosis not present

## 2017-06-28 DIAGNOSIS — Z87891 Personal history of nicotine dependence: Secondary | ICD-10-CM | POA: Diagnosis not present

## 2017-06-28 DIAGNOSIS — Z9114 Patient's other noncompliance with medication regimen: Secondary | ICD-10-CM | POA: Diagnosis not present

## 2017-06-28 DIAGNOSIS — Z590 Homelessness: Secondary | ICD-10-CM | POA: Diagnosis not present

## 2017-06-28 DIAGNOSIS — M47896 Other spondylosis, lumbar region: Secondary | ICD-10-CM | POA: Diagnosis not present

## 2017-06-28 DIAGNOSIS — R918 Other nonspecific abnormal finding of lung field: Secondary | ICD-10-CM | POA: Diagnosis not present

## 2017-06-30 ENCOUNTER — Ambulatory Visit: Payer: Medicare Other | Admitting: Family Medicine

## 2017-06-30 DIAGNOSIS — X58XXXA Exposure to other specified factors, initial encounter: Secondary | ICD-10-CM | POA: Diagnosis not present

## 2017-06-30 DIAGNOSIS — S39012A Strain of muscle, fascia and tendon of lower back, initial encounter: Secondary | ICD-10-CM | POA: Diagnosis not present

## 2017-06-30 DIAGNOSIS — Y998 Other external cause status: Secondary | ICD-10-CM | POA: Diagnosis not present

## 2017-07-06 ENCOUNTER — Ambulatory Visit (HOSPITAL_COMMUNITY)
Admission: EM | Admit: 2017-07-06 | Discharge: 2017-07-06 | Disposition: A | Discharge status: Home / Self Care | Payer: Medicare Other | Attending: Family | Admitting: Family

## 2017-07-06 ENCOUNTER — Encounter (HOSPITAL_COMMUNITY): Payer: Self-pay | Admitting: Emergency Medicine

## 2017-07-06 DIAGNOSIS — T148XXA Other injury of unspecified body region, initial encounter: Secondary | ICD-10-CM

## 2017-07-06 DIAGNOSIS — L089 Local infection of the skin and subcutaneous tissue, unspecified: Secondary | ICD-10-CM | POA: Diagnosis not present

## 2017-07-06 DIAGNOSIS — L03116 Cellulitis of left lower limb: Secondary | ICD-10-CM

## 2017-07-06 MED ORDER — SULFAMETHOXAZOLE-TRIMETHOPRIM 800-160 MG PO TABS: 1.0000 | ORAL_TABLET | Freq: Two times a day (BID) | ORAL | 0 refills | Status: DC

## 2017-07-06 NOTE — ED Triage Notes (Signed)
Patient has a wound at the lower left leg.  Patient reports wound is worsening.  There is a 50 cent sized wound on left lateral leg.  Area around wound is warm to touch, wound is draining, no odor noticed

## 2017-07-06 NOTE — Discharge Instructions (Signed)
Recommend start antibiotic (Bactrim DS) 1 tablet twice a day as directed. Try to keep feet elevated and take Lasix (fluid pill) to help with healing. Recommend contact [facility] tomorrow for further evaluation.

## 2017-07-07 MED FILL — SULFAMETHOXAZOLE-TMP DS TAB: 800-160 | 10 days supply | Qty: 20 | Fill #0
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
## 2017-07-08 ENCOUNTER — Ambulatory Visit: Payer: Medicare Other | Attending: Family Medicine | Admitting: Licensed Clinical Social Worker

## 2017-07-08 ENCOUNTER — Ambulatory Visit: Payer: Medicare Other | Attending: Family Medicine | Admitting: Family Medicine

## 2017-07-08 ENCOUNTER — Encounter: Payer: Self-pay | Admitting: Family Medicine

## 2017-07-08 VITALS — BP 145/74 | HR 61 | Temp 98.4°F | Resp 18 | Ht 63.0 in | Wt 277.0 lb

## 2017-07-08 DIAGNOSIS — L03119 Cellulitis of unspecified part of limb: Secondary | ICD-10-CM | POA: Diagnosis not present

## 2017-07-08 DIAGNOSIS — I10 Essential (primary) hypertension: Secondary | ICD-10-CM | POA: Insufficient documentation

## 2017-07-08 DIAGNOSIS — Z79899 Other long term (current) drug therapy: Secondary | ICD-10-CM | POA: Insufficient documentation

## 2017-07-08 DIAGNOSIS — Z5900 Homelessness unspecified: Secondary | ICD-10-CM

## 2017-07-08 NOTE — Patient Instructions (Addendum)
Take your medications that was prescribed for cellulitus and hypertension. Follow up next Tuesday.  Cellulitis, Adult Cellulitis is a skin infection. The infected area is usually red and sore. This condition occurs most often in the arms and lower legs. It is very important to get treated for this condition. Follow these instructions at home:  Take over-the-counter and prescription medicines only as told by your doctor.  If you were prescribed an antibiotic medicine, take it as told by your doctor. Do not stop taking the antibiotic even if you start to feel better.  Drink enough fluid to keep your pee (urine) clear or pale yellow.  Do not touch or rub the infected area.  Raise (elevate) the infected area above the level of your heart while you are sitting or lying down.  Place warm or cold wet cloths (warm or cold compresses) on the infected area. Do this as told by your doctor.  Keep all follow-up visits as told by your doctor. This is important. These visits let your doctor make sure your infection is not getting worse. Contact a doctor if:  You have a fever.  Your symptoms do not get better after 1-2 days of treatment.  Your bone or joint under the infected area starts to hurt after the skin has healed.  Your infection comes back. This can happen in the same area or another area.  You have a swollen bump in the infected area.  You have new symptoms.  You feel ill and also have muscle aches and pains. Get help right away if:  Your symptoms get worse.  You feel very sleepy.  You throw up (vomit) or have watery poop (diarrhea) for a long time.  There are red streaks coming from the infected area.  Your red area gets larger.  Your red area turns darker. This information is not intended to replace advice given to you by your health care provider. Make sure you discuss any questions you have with your health care provider. Document Released: 03/24/2008 Document Revised:  03/13/2016 Document Reviewed: 08/15/2015 Elsevier Interactive Patient Education  2018 [employer].

## 2017-07-08 NOTE — Progress Notes (Signed)
Subjective:  Patient ID: Beth Hall, female    DOB: March 22, 1941  Age: 76 y.o. MRN: [id number]  CC: Hypertension and Follow-up   HPI Beth Hall presents to establish care. History of HTN. She is not exercising and is not adherent to low salt diet.  She does not check BP. She reports not taking BP medication since Sunday. Cardiac symptoms none. Patient denies chest pain, chest pressure/discomfort, near-syncope and syncope.  Cardiovascular risk factors: advanced age (older than 61 for men, 72 for women), hypertension, obesity (BMI >= 30 kg/m2) and sedentary lifestyle. Use of agents associated with hypertension: none. History of target organ damage: none. History of infected abrasion to BLE. Recent history of ED visit. She reports being prescribed course of antibiotics which she reports she started taking yesterday. Associated symptoms include swelling. Gauze dressings in place. She is agreeable o speaking with LCSW today.   Outpatient Medications Prior to Visit  Medication Sig Dispense Refill  . acetaminophen (TYLENOL) 500 MG tablet Take 1 tablet (500 mg total) by mouth every 6 (six) hours as needed. (Patient taking differently: Take 500 mg by mouth every 6 (six) hours as needed. ) 30 tablet 0  . albuterol (PROVENTIL HFA;VENTOLIN HFA) 108 (90 Base) MCG/ACT inhaler Inhale 2 puffs into the lungs every 4 (four) hours as needed for wheezing or shortness of breath. (Patient not taking: Reported on 06/10/2017) 1 Inhaler 0  . amLODipine (NORVASC) 2.5 MG tablet Take 1 tablet (2.5 mg total) by mouth daily. 30 tablet 2  . docusate sodium (COLACE) 100 MG capsule Take 1 capsule (100 mg total) by mouth every 12 (twelve) hours. 60 capsule 1  . furosemide (LASIX) 20 MG tablet Take 1 tablet (20 mg total) by mouth daily. 14 tablet 0  . mupirocin ointment (BACTROBAN) 2 % Apply 1 application topically 3 (three) times daily. 30 g 1  . senna (SENOKOT) 8.6 MG tablet Take 8.6 mg by mouth every evening.    .  sulfamethoxazole-trimethoprim (BACTRIM DS,SEPTRA DS) 800-160 MG tablet Take 1 tablet by mouth 2 (two) times daily. 20 tablet 0  . triamcinolone cream (KENALOG) 0.1 % Apply 1 application topically 4 (four) times daily as needed. (Patient taking differently: Apply 1 application topically 4 (four) times daily as needed (rash). ) 30 g 0   No facility-administered medications prior to visit.     ROS Review of Systems  Constitutional: Negative.   Eyes: Negative.   Respiratory: Negative.   Cardiovascular: Negative.   Gastrointestinal: Negative.   Musculoskeletal:       Wheelchair bound   Skin: Positive for wound.  Neurological: Negative.   Psychiatric/Behavioral: Positive for behavioral problems. Negative for suicidal ideas.    Objective:  BP (!) 145/74 (BP Location: Left Arm, Patient Position: Sitting, Cuff Size: Large)   Pulse 61   Temp 98.4 F (36.9 C) (Oral)   Resp 18   Ht  (1.6 m)   Wt 277 lb (125.6 kg)   SpO2 92%   BMI 49.07 kg/m   BP/Weight 07/08/2017 07/06/2017 06/21/2017  Systolic BP 145 137 171  Diastolic BP 74 73 95  Wt. (Lbs) 277 - -  BMI 49.07 - -    Physical Exam  Constitutional: She appears well-developed and well-nourished.  Eyes: Pupils are equal, round, and reactive to light. Conjunctivae are normal.  Neck: No JVD present.  Cardiovascular: Normal rate, regular rhythm, normal heart sounds and intact distal pulses.   Pulmonary/Chest: Effort normal and breath sounds normal.  Abdominal:  Soft. Bowel sounds are normal. There is no tenderness.  Skin: Skin is warm and dry. Abrasion noted.  Several, shallow, abrasions to bilateral lower extremities. Dressings dry and intact, yellow exudate present.  Psychiatric: Her speech is rapid and/or pressured and tangential. She expresses no homicidal and no suicidal ideation. She expresses no suicidal plans and no homicidal plans.  Nursing note and vitals reviewed.   Assessment & Plan:   1. Cellulitis of lower  extremity, unspecified laterality -Dressing changed performed in office. Antibiotic ointment applied to areas, covered with non-adherent Telfa dressing, wrapped with Kerlix and ace bandages. -Referral to wound care center, although patient's history of homeless is a barrier. LCSW spoke with patient and provided resources. - AMB referral to wound care center  2. Essential hypertension  -Encouraged adherence with medications. -Recheck BP at next visit.    Follow-up: Return in about 6 days (around 07/14/2017) for [facility] f/u & wound check.   Lizbeth Bark FNP

## 2017-07-08 NOTE — Progress Notes (Signed)
Patient is here for hospital f/up  

## 2017-07-09 NOTE — BH Specialist Note (Signed)
Integrated Behavioral Health Follow Up Visit  MRN: [id number] Name: Beth Hall  Number of Integrated Behavioral Health Clinician visits: 3/6 Session Start time: 11:00 AM  Session End time: 11:30 AM Total time: 30 minutes  Type of Service: Integrated Behavioral Health- Individual/Family Interpretor:No. Interpretor Name and Language: N/A  SUBJECTIVE: Beth Hall is a 76 y.o. female accompanied by self  Patient was referred by FNP [place] for depression and anxiety. Patient reports the following symptoms/concerns: homelessness Duration of problem: 2 months; Severity of problem: NA  OBJECTIVE: Mood: Pleasant and Affect: Appropriate Risk of harm to self or others: No plan to harm self or others  LIFE CONTEXT: Family and Social: Pt is currently homeless. She has been residing between the [employer] and [address]. School/Work: Pt receives [employer] [phone]) and Medicare.  Self-Care: Pt reports that she enjoys reading the bible and writing to cope with stressors Life Changes: Pt is homeless and has limited support  GOALS ADDRESSED: Patient will: 1.  Reduce symptoms of: stress  2.  Increase knowledge and/or ability of: coping skills and stress reduction  3.  Demonstrate ability to: Increase adequate support systems for patient/family and Improve medication compliance  INTERVENTIONS: Interventions utilized:  Solution-Focused Strategies and Supportive Counseling Standardized Assessments completed: Patient declined screening  ASSESSMENT: Patient currently experiencing anxiety triggered by current homelessness.   Patient may benefit from psychotherapy and medication management. Pt receives income and is working closely with community agencies to obtain stable housing. She reported that she does not need a payee to assist with management of finances and is currently residing in a motel. She has been in contact with granddaughter, who is able to provide limited support when  needed. Pt is not interested in participating in psychotherapy or medication management.    PLAN: 1. Follow up with behavioral health clinician on : Pt was encouraged tocontact LCSWA if symptoms worsen or fail to improveto schedule behavioral appointments at [facility]. 2. Behavioral recommendations: LCSWA recommends that pt apply healthy coping skills discussed and utilize provided resources. Pt is encouraged to schedule follow up appointment with LCSWA 3. Referral(s): [employer]:  Housing 4. "From scale of 1-10, how likely are you to follow plan?": 8/10  Bridgett Larsson, LCSW 07/10/17 10:28 AM

## 2017-07-11 DIAGNOSIS — M79661 Pain in right lower leg: Secondary | ICD-10-CM | POA: Diagnosis not present

## 2017-07-11 DIAGNOSIS — Z791 Long term (current) use of non-steroidal anti-inflammatories (NSAID): Secondary | ICD-10-CM | POA: Diagnosis not present

## 2017-07-11 DIAGNOSIS — L03115 Cellulitis of right lower limb: Secondary | ICD-10-CM | POA: Diagnosis not present

## 2017-07-11 DIAGNOSIS — Z888 Allergy status to other drugs, medicaments and biological substances status: Secondary | ICD-10-CM | POA: Diagnosis not present

## 2017-07-11 DIAGNOSIS — M79606 Pain in leg, unspecified: Secondary | ICD-10-CM | POA: Diagnosis not present

## 2017-07-11 DIAGNOSIS — M79662 Pain in left lower leg: Secondary | ICD-10-CM | POA: Diagnosis not present

## 2017-07-11 DIAGNOSIS — L03116 Cellulitis of left lower limb: Secondary | ICD-10-CM | POA: Diagnosis not present

## 2017-07-11 DIAGNOSIS — I10 Essential (primary) hypertension: Secondary | ICD-10-CM | POA: Diagnosis not present

## 2017-07-11 DIAGNOSIS — Z87891 Personal history of nicotine dependence: Secondary | ICD-10-CM | POA: Diagnosis not present

## 2017-07-12 DIAGNOSIS — M7989 Other specified soft tissue disorders: Secondary | ICD-10-CM | POA: Diagnosis not present

## 2017-07-12 DIAGNOSIS — I499 Cardiac arrhythmia, unspecified: Secondary | ICD-10-CM | POA: Diagnosis not present

## 2017-07-12 DIAGNOSIS — R2243 Localized swelling, mass and lump, lower limb, bilateral: Secondary | ICD-10-CM | POA: Diagnosis not present

## 2017-07-12 DIAGNOSIS — R001 Bradycardia, unspecified: Secondary | ICD-10-CM | POA: Diagnosis not present

## 2017-07-13 DIAGNOSIS — Z590 Homelessness: Secondary | ICD-10-CM | POA: Diagnosis not present

## 2017-07-13 DIAGNOSIS — I10 Essential (primary) hypertension: Secondary | ICD-10-CM | POA: Diagnosis not present

## 2017-07-13 DIAGNOSIS — Z9981 Dependence on supplemental oxygen: Secondary | ICD-10-CM | POA: Diagnosis not present

## 2017-07-13 DIAGNOSIS — Z888 Allergy status to other drugs, medicaments and biological substances status: Secondary | ICD-10-CM | POA: Diagnosis not present

## 2017-07-13 DIAGNOSIS — Z79899 Other long term (current) drug therapy: Secondary | ICD-10-CM | POA: Diagnosis not present

## 2017-07-13 DIAGNOSIS — Z87891 Personal history of nicotine dependence: Secondary | ICD-10-CM | POA: Diagnosis not present

## 2017-07-13 DIAGNOSIS — G8311 Monoplegia of lower limb affecting right dominant side: Secondary | ICD-10-CM | POA: Diagnosis not present

## 2017-07-13 DIAGNOSIS — M79605 Pain in left leg: Secondary | ICD-10-CM | POA: Diagnosis not present

## 2017-07-13 DIAGNOSIS — M79604 Pain in right leg: Secondary | ICD-10-CM | POA: Diagnosis not present

## 2017-07-13 DIAGNOSIS — M1991 Primary osteoarthritis, unspecified site: Secondary | ICD-10-CM | POA: Diagnosis not present

## 2017-07-13 DIAGNOSIS — F4310 Post-traumatic stress disorder, unspecified: Secondary | ICD-10-CM | POA: Diagnosis not present

## 2017-07-13 DIAGNOSIS — Z7982 Long term (current) use of aspirin: Secondary | ICD-10-CM | POA: Diagnosis not present

## 2017-07-13 DIAGNOSIS — M79662 Pain in left lower leg: Secondary | ICD-10-CM | POA: Diagnosis not present

## 2017-07-13 DIAGNOSIS — M79661 Pain in right lower leg: Secondary | ICD-10-CM | POA: Diagnosis not present

## 2017-07-14 ENCOUNTER — Ambulatory Visit: Payer: Medicare Other | Admitting: Family Medicine

## 2017-07-15 ENCOUNTER — Encounter: Payer: Self-pay | Admitting: Family Medicine

## 2017-07-15 ENCOUNTER — Ambulatory Visit: Payer: Medicare Other | Attending: Family Medicine | Admitting: Family Medicine

## 2017-07-15 VITALS — BP 137/75 | HR 73 | Temp 98.5°F | Resp 18 | Ht 63.0 in | Wt 276.4 lb

## 2017-07-15 DIAGNOSIS — M25562 Pain in left knee: Secondary | ICD-10-CM | POA: Diagnosis not present

## 2017-07-15 DIAGNOSIS — Z4800 Encounter for change or removal of nonsurgical wound dressing: Secondary | ICD-10-CM | POA: Diagnosis not present

## 2017-07-15 DIAGNOSIS — L03119 Cellulitis of unspecified part of limb: Secondary | ICD-10-CM | POA: Insufficient documentation

## 2017-07-15 DIAGNOSIS — S8992XA Unspecified injury of left lower leg, initial encounter: Secondary | ICD-10-CM | POA: Diagnosis not present

## 2017-07-15 DIAGNOSIS — M7652 Patellar tendinitis, left knee: Secondary | ICD-10-CM | POA: Diagnosis not present

## 2017-07-15 DIAGNOSIS — M170 Bilateral primary osteoarthritis of knee: Secondary | ICD-10-CM | POA: Diagnosis not present

## 2017-07-15 DIAGNOSIS — Z87891 Personal history of nicotine dependence: Secondary | ICD-10-CM | POA: Diagnosis not present

## 2017-07-15 DIAGNOSIS — Z6841 Body Mass Index (BMI) 40.0 and over, adult: Secondary | ICD-10-CM | POA: Diagnosis not present

## 2017-07-15 DIAGNOSIS — Z79899 Other long term (current) drug therapy: Secondary | ICD-10-CM | POA: Insufficient documentation

## 2017-07-15 DIAGNOSIS — E669 Obesity, unspecified: Secondary | ICD-10-CM | POA: Diagnosis not present

## 2017-07-15 DIAGNOSIS — I10 Essential (primary) hypertension: Secondary | ICD-10-CM | POA: Diagnosis not present

## 2017-07-15 DIAGNOSIS — Z7982 Long term (current) use of aspirin: Secondary | ICD-10-CM | POA: Diagnosis not present

## 2017-07-15 DIAGNOSIS — M79606 Pain in leg, unspecified: Secondary | ICD-10-CM | POA: Diagnosis not present

## 2017-07-15 DIAGNOSIS — Z5900 Homelessness unspecified: Secondary | ICD-10-CM

## 2017-07-15 DIAGNOSIS — M25561 Pain in right knee: Secondary | ICD-10-CM | POA: Diagnosis not present

## 2017-07-15 DIAGNOSIS — S8991XA Unspecified injury of right lower leg, initial encounter: Secondary | ICD-10-CM | POA: Diagnosis not present

## 2017-07-15 DIAGNOSIS — Z885 Allergy status to narcotic agent status: Secondary | ICD-10-CM | POA: Diagnosis not present

## 2017-07-15 MED ORDER — BACITRACIN 500 UNIT/GM EX OINT: 1.0000 "application " | TOPICAL_OINTMENT | Freq: Once | CUTANEOUS | Status: AC

## 2017-07-15 MED ORDER — SULFAMETHOXAZOLE-TRIMETHOPRIM 800-160 MG PO TABS: 1.0000 | ORAL_TABLET | Freq: Two times a day (BID) | ORAL | 0 refills | Status: AC

## 2017-07-15 NOTE — Progress Notes (Signed)
Patient is here for wound check

## 2017-07-15 NOTE — Patient Instructions (Signed)

## 2017-07-18 ENCOUNTER — Encounter (HOSPITAL_COMMUNITY): Payer: Self-pay | Admitting: *Deleted

## 2017-07-18 ENCOUNTER — Emergency Department (HOSPITAL_COMMUNITY)
Admission: EM | Admit: 2017-07-18 | Discharge: 2017-07-18 | Disposition: A | Discharge status: Home / Self Care | Payer: Medicare Other | Attending: Emergency Medicine | Admitting: Emergency Medicine

## 2017-07-18 ENCOUNTER — Emergency Department (HOSPITAL_COMMUNITY)
Admission: EM | Admit: 2017-07-18 | Discharge: 2017-07-18 | Disposition: A | Discharge status: Home / Self Care | Payer: Medicare Other | Source: Home / Self Care

## 2017-07-18 DIAGNOSIS — M79605 Pain in left leg: Secondary | ICD-10-CM | POA: Diagnosis not present

## 2017-07-18 DIAGNOSIS — Z5321 Procedure and treatment not carried out due to patient leaving prior to being seen by health care provider: Secondary | ICD-10-CM | POA: Insufficient documentation

## 2017-07-18 DIAGNOSIS — I10 Essential (primary) hypertension: Secondary | ICD-10-CM | POA: Diagnosis not present

## 2017-07-18 DIAGNOSIS — M79606 Pain in leg, unspecified: Secondary | ICD-10-CM

## 2017-07-18 DIAGNOSIS — M79604 Pain in right leg: Secondary | ICD-10-CM | POA: Diagnosis not present

## 2017-07-18 DIAGNOSIS — Z87891 Personal history of nicotine dependence: Secondary | ICD-10-CM | POA: Diagnosis not present

## 2017-07-18 DIAGNOSIS — Z79899 Other long term (current) drug therapy: Secondary | ICD-10-CM | POA: Diagnosis not present
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
## 2017-07-18 NOTE — Discharge Instructions (Signed)
You will need to see your primary doctor.  They can review the records from [facility] and the results.
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
## 2017-07-19 ENCOUNTER — Encounter (HOSPITAL_COMMUNITY): Payer: Self-pay

## 2017-07-19 ENCOUNTER — Emergency Department (HOSPITAL_COMMUNITY)
Admission: EM | Admit: 2017-07-19 | Discharge: 2017-07-20 | Disposition: A | Discharge status: Home / Self Care | Payer: Medicare Other | Attending: Emergency Medicine | Admitting: Emergency Medicine

## 2017-07-19 DIAGNOSIS — G8929 Other chronic pain: Secondary | ICD-10-CM | POA: Diagnosis not present

## 2017-07-19 DIAGNOSIS — Z87891 Personal history of nicotine dependence: Secondary | ICD-10-CM | POA: Insufficient documentation

## 2017-07-19 DIAGNOSIS — I10 Essential (primary) hypertension: Secondary | ICD-10-CM | POA: Diagnosis not present

## 2017-07-19 DIAGNOSIS — M79605 Pain in left leg: Secondary | ICD-10-CM | POA: Insufficient documentation

## 2017-07-19 DIAGNOSIS — M79662 Pain in left lower leg: Secondary | ICD-10-CM | POA: Diagnosis not present

## 2017-07-19 DIAGNOSIS — M79604 Pain in right leg: Secondary | ICD-10-CM | POA: Insufficient documentation

## 2017-07-19 DIAGNOSIS — M79661 Pain in right lower leg: Secondary | ICD-10-CM | POA: Diagnosis not present

## 2017-07-19 DIAGNOSIS — Z79899 Other long term (current) drug therapy: Secondary | ICD-10-CM | POA: Diagnosis not present

## 2017-07-19 MED ORDER — CYCLOBENZAPRINE HCL 10 MG PO TABS
5.0000 mg | ORAL_TABLET | Freq: Once | ORAL | Status: AC
  Administered 2017-07-20: 5 mg via ORAL
  Filled 2017-07-19: qty 1
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
## 2017-07-20 DIAGNOSIS — Z87891 Personal history of nicotine dependence: Secondary | ICD-10-CM | POA: Diagnosis not present

## 2017-07-20 DIAGNOSIS — I10 Essential (primary) hypertension: Secondary | ICD-10-CM | POA: Diagnosis not present

## 2017-07-20 DIAGNOSIS — M79604 Pain in right leg: Secondary | ICD-10-CM | POA: Diagnosis not present

## 2017-07-20 DIAGNOSIS — Z590 Homelessness: Secondary | ICD-10-CM | POA: Diagnosis not present

## 2017-07-20 DIAGNOSIS — L97829 Non-pressure chronic ulcer of other part of left lower leg with unspecified severity: Secondary | ICD-10-CM | POA: Diagnosis not present

## 2017-07-20 DIAGNOSIS — G8929 Other chronic pain: Secondary | ICD-10-CM | POA: Diagnosis not present

## 2017-07-20 DIAGNOSIS — Y998 Other external cause status: Secondary | ICD-10-CM | POA: Diagnosis not present

## 2017-07-20 DIAGNOSIS — S81802D Unspecified open wound, left lower leg, subsequent encounter: Secondary | ICD-10-CM | POA: Diagnosis not present

## 2017-07-20 DIAGNOSIS — X58XXXA Exposure to other specified factors, initial encounter: Secondary | ICD-10-CM | POA: Diagnosis not present

## 2017-07-20 DIAGNOSIS — R600 Localized edema: Secondary | ICD-10-CM | POA: Diagnosis not present

## 2017-07-20 DIAGNOSIS — M79605 Pain in left leg: Secondary | ICD-10-CM | POA: Diagnosis not present

## 2017-07-20 MED FILL — SULFAMETHOXAZOLE-TMP DS TAB: 800-160 | 7 days supply | Qty: 14 | Fill #0

## 2017-07-20 NOTE — ED Notes (Signed)
Py sleeping when RN entered room. Woke patient up for medication administration

## 2017-07-20 NOTE — ED Notes (Signed)
Pt understood dc material. NAD noted. 

## 2017-07-20 NOTE — Discharge Instructions (Signed)
You were seen today for leg pain. Continue Tylenol as needed for pain. Attempt to get your electronic wheelchair. This will help her pain given that your pain is worse with the manual wheelchair. Follow-up closely with your primary physician.

## 2017-07-24 ENCOUNTER — Ambulatory Visit: Payer: Medicare Other | Attending: Family Medicine | Admitting: Family Medicine

## 2017-07-24 ENCOUNTER — Encounter: Payer: Self-pay | Admitting: Family Medicine

## 2017-07-24 VITALS — BP 150/78 | HR 91 | Temp 97.7°F | Resp 18 | Ht 62.0 in | Wt 271.4 lb

## 2017-07-24 DIAGNOSIS — I10 Essential (primary) hypertension: Secondary | ICD-10-CM | POA: Insufficient documentation

## 2017-07-24 DIAGNOSIS — E669 Obesity, unspecified: Secondary | ICD-10-CM | POA: Insufficient documentation

## 2017-07-24 DIAGNOSIS — X58XXXA Exposure to other specified factors, initial encounter: Secondary | ICD-10-CM | POA: Insufficient documentation

## 2017-07-24 DIAGNOSIS — Z5900 Homelessness unspecified: Secondary | ICD-10-CM

## 2017-07-24 DIAGNOSIS — T07XXXA Unspecified multiple injuries, initial encounter: Secondary | ICD-10-CM

## 2017-07-24 DIAGNOSIS — T148XXA Other injury of unspecified body region, initial encounter: Secondary | ICD-10-CM | POA: Diagnosis not present

## 2017-07-24 DIAGNOSIS — Z79899 Other long term (current) drug therapy: Secondary | ICD-10-CM | POA: Diagnosis not present

## 2017-07-24 MED ORDER — HYDROCHLOROTHIAZIDE 12.5 MG PO TABS: 12.5000 mg | ORAL_TABLET | Freq: Every day | ORAL | 2 refills | Status: DC

## 2017-07-24 MED ORDER — AMLODIPINE BESYLATE 10 MG PO TABS: 10.0000 mg | ORAL_TABLET | Freq: Every day | ORAL | 2 refills | Status: DC

## 2017-07-24 MED FILL — AMLODIPINE BESYLATE 10 MG T: 10 | 30 days supply | Qty: 30 | Fill #0

## 2017-07-24 MED FILL — HYDROCHLOROTHIAZIDE 12.5 MG: 12.5 | 30 days supply | Qty: 30 | Fill #0

## 2017-07-24 NOTE — Progress Notes (Signed)
Patient is here for f/up wound check  

## 2017-07-24 NOTE — Patient Instructions (Signed)

## 2017-07-25 DIAGNOSIS — I10 Essential (primary) hypertension: Secondary | ICD-10-CM | POA: Diagnosis not present

## 2017-07-25 DIAGNOSIS — Z888 Allergy status to other drugs, medicaments and biological substances status: Secondary | ICD-10-CM | POA: Diagnosis not present

## 2017-07-25 DIAGNOSIS — M25562 Pain in left knee: Secondary | ICD-10-CM | POA: Diagnosis not present

## 2017-07-25 DIAGNOSIS — Z87891 Personal history of nicotine dependence: Secondary | ICD-10-CM | POA: Diagnosis not present

## 2017-07-25 DIAGNOSIS — R531 Weakness: Secondary | ICD-10-CM | POA: Diagnosis not present

## 2017-07-25 DIAGNOSIS — M25561 Pain in right knee: Secondary | ICD-10-CM | POA: Diagnosis not present

## 2017-07-25 DIAGNOSIS — R404 Transient alteration of awareness: Secondary | ICD-10-CM | POA: Diagnosis not present

## 2017-07-25 DIAGNOSIS — F4310 Post-traumatic stress disorder, unspecified: Secondary | ICD-10-CM | POA: Diagnosis not present

## 2017-07-25 DIAGNOSIS — Z9981 Dependence on supplemental oxygen: Secondary | ICD-10-CM | POA: Diagnosis not present

## 2017-07-25 DIAGNOSIS — G8311 Monoplegia of lower limb affecting right dominant side: Secondary | ICD-10-CM | POA: Diagnosis not present

## 2017-07-25 DIAGNOSIS — Z7982 Long term (current) use of aspirin: Secondary | ICD-10-CM | POA: Diagnosis not present

## 2017-07-25 DIAGNOSIS — M1991 Primary osteoarthritis, unspecified site: Secondary | ICD-10-CM | POA: Diagnosis not present

## 2017-07-25 LAB — LIPID PANEL
CHOLESTEROL TOTAL: 138 mg/dL (ref 100–199)
Chol/HDL Ratio: 3.3 ratio (ref 0.0–4.4)
HDL: 42 mg/dL (ref >39)
LDL CALC: 73 mg/dL (ref 0–99)
Triglycerides: 115 mg/dL (ref 0–149)
VLDL CHOLESTEROL CAL: 23 mg/dL (ref 5–40)

## 2017-07-29 ENCOUNTER — Other Ambulatory Visit: Payer: Self-pay | Admitting: Family Medicine

## 2017-07-29 DIAGNOSIS — L03116 Cellulitis of left lower limb: Principal | ICD-10-CM

## 2017-07-29 DIAGNOSIS — T07XXXA Unspecified multiple injuries, initial encounter: Secondary | ICD-10-CM

## 2017-07-29 DIAGNOSIS — L03115 Cellulitis of right lower limb: Secondary | ICD-10-CM

## 2017-07-30 DIAGNOSIS — F4310 Post-traumatic stress disorder, unspecified: Secondary | ICD-10-CM | POA: Diagnosis not present

## 2017-07-30 DIAGNOSIS — I872 Venous insufficiency (chronic) (peripheral): Secondary | ICD-10-CM | POA: Diagnosis not present

## 2017-07-30 DIAGNOSIS — I8311 Varicose veins of right lower extremity with inflammation: Secondary | ICD-10-CM | POA: Diagnosis not present

## 2017-07-30 DIAGNOSIS — Z87891 Personal history of nicotine dependence: Secondary | ICD-10-CM | POA: Diagnosis not present

## 2017-07-30 DIAGNOSIS — I8312 Varicose veins of left lower extremity with inflammation: Secondary | ICD-10-CM | POA: Diagnosis not present

## 2017-07-30 DIAGNOSIS — G8191 Hemiplegia, unspecified affecting right dominant side: Secondary | ICD-10-CM | POA: Diagnosis not present

## 2017-07-30 DIAGNOSIS — R2243 Localized swelling, mass and lump, lower limb, bilateral: Secondary | ICD-10-CM | POA: Diagnosis not present

## 2017-07-30 DIAGNOSIS — Z590 Homelessness: Secondary | ICD-10-CM | POA: Diagnosis not present

## 2017-07-30 DIAGNOSIS — I10 Essential (primary) hypertension: Secondary | ICD-10-CM | POA: Diagnosis not present

## 2017-07-30 DIAGNOSIS — Z79899 Other long term (current) drug therapy: Secondary | ICD-10-CM | POA: Diagnosis not present

## 2017-07-30 DIAGNOSIS — M1991 Primary osteoarthritis, unspecified site: Secondary | ICD-10-CM | POA: Diagnosis not present

## 2017-07-30 DIAGNOSIS — Z7982 Long term (current) use of aspirin: Secondary | ICD-10-CM | POA: Diagnosis not present

## 2017-07-30 DIAGNOSIS — Z888 Allergy status to other drugs, medicaments and biological substances status: Secondary | ICD-10-CM | POA: Diagnosis not present

## 2017-07-31 ENCOUNTER — Telehealth: Payer: Self-pay

## 2017-07-31 NOTE — Telephone Encounter (Signed)
CMA call regarding lab results  Patient did not answer & unable to leave message  

## 2017-07-31 NOTE — Telephone Encounter (Signed)
-----   Message from Lizbeth Bark, FNP sent at 07/30/2017  1:15 PM EDT ----- Cholesterol levels are normal.

## 2017-08-01 NOTE — Progress Notes (Signed)
Subjective:  Patient ID: Beth Hall, female    DOB: Jun 16, 1941  Age: 76 y.o. MRN: [id number]  CC: Follow-up   HPI MEIYA WISLER presents for wound care check.  History of infected abrasion to BLE. Recent history of ED visit. She reports adherence with antibiotics. Associated symptoms include swelling. Gauze dressings in place.Marland Kitchen History of HTN. Use of agents associated with hypertension: none. History of target organ damage: none. She is not exercising and is not adherent to low salt diet.  She does not check BP. She reports not taking BP medication. Cardiac symptoms none. Patient denies chest pain, chest pressure/discomfort, near-syncope and syncope.  Cardiovascular risk factors: advanced age (older than 44 for men, 77 for women), hypertension, obesity (BMI >= 30 kg/m2) and sedentary lifestyle.  Outpatient Medications Prior to Visit  Medication Sig Dispense Refill  . acetaminophen (TYLENOL) 500 MG tablet Take 1 tablet (500 mg total) by mouth every 6 (six) hours as needed. (Patient taking differently: Take 500 mg by mouth every 6 (six) hours as needed. ) 30 tablet 0  . albuterol (PROVENTIL HFA;VENTOLIN HFA) 108 (90 Base) MCG/ACT inhaler Inhale 2 puffs into the lungs every 4 (four) hours as needed for wheezing or shortness of breath. (Patient not taking: Reported on 06/10/2017) 1 Inhaler 0  . docusate sodium (COLACE) 100 MG capsule Take 1 capsule (100 mg total) by mouth every 12 (twelve) hours. 60 capsule 1  . furosemide (LASIX) 20 MG tablet Take 1 tablet (20 mg total) by mouth daily. 14 tablet 0  . mupirocin ointment (BACTROBAN) 2 % Apply 1 application topically 3 (three) times daily. 30 g 1  . senna (SENOKOT) 8.6 MG tablet Take 8.6 mg by mouth every evening.    . sulfamethoxazole-trimethoprim (BACTRIM DS,SEPTRA DS) 800-160 MG tablet Take 1 tablet by mouth 2 (two) times daily. 14 tablet 0  . triamcinolone cream (KENALOG) 0.1 % Apply 1 application topically 4 (four) times daily as needed.  (Patient taking differently: Apply 1 application topically 4 (four) times daily as needed (rash). ) 30 g 0  . amLODipine (NORVASC) 2.5 MG tablet Take 1 tablet (2.5 mg total) by mouth daily. 30 tablet 2   Facility-Administered Medications Prior to Visit  Medication Dose Route Frequency Provider Last Rate Last Dose  . bacitracin ointment 1 application  1 application Topical Once Vera Furniss R, FNP        ROS Review of Systems  Constitutional: Negative.   Eyes: Negative.   Respiratory: Negative.   Cardiovascular: Negative.   Gastrointestinal: Negative.   Musculoskeletal:       Wheelchair bound   Skin: Positive for wound.  Neurological: Negative.   Psychiatric/Behavioral: Positive for behavioral problems. Negative for suicidal ideas.    Objective:  BP (!) 150/78 (BP Location: Left Arm, Patient Position: Sitting, Cuff Size: Normal)   Pulse 91   Temp 97.7 F (36.5 C) (Oral)   Resp 18   Ht  (1.575 m)   Wt 271 lb 6.4 oz (123.1 kg)   SpO2 90%   BMI 49.64 kg/m   BP/Weight 07/24/2017 07/20/2017 07/19/2017  Systolic BP 150 146 -  Diastolic BP 78 91 -  Wt. (Lbs) 271.4 - 276  BMI 49.64 - 49.68    Physical Exam  Constitutional: She appears well-developed and well-nourished.  Eyes: Pupils are equal, round, and reactive to light. Conjunctivae are normal.  Neck: No JVD present.  Cardiovascular: Normal rate, regular rhythm, normal heart sounds and intact distal pulses.  Pulmonary/Chest: Effort normal and breath sounds normal.  Abdominal: Soft. Bowel sounds are normal. There is no tenderness.  Skin: Skin is warm and dry. Abrasion noted.  Several, shallow, abrasions to bilateral lower extremities. Dressings dry and intact, yellow exudate present.  Psychiatric: Her speech is rapid and/or pressured and tangential. She expresses no homicidal and no suicidal ideation. She expresses no suicidal plans and no homicidal plans.  Nursing note and vitals reviewed.   Assessment &  Plan:   1. Essential hypertension  - amLODipine (NORVASC) 10 MG tablet; Take 1 tablet (10 mg total) by mouth daily.  Dispense: 30 tablet; Refill: 2 - Lipid Panel  2. Abrasions of multiple sites Doubtful she is keeping wounds clean and dry, concern for risk of complicated wound healing or infection.  She benefit from [facility] [address] however homelessness is a barrier will discuss options with case managment.   3. Homelessness     Follow-up: Return in about 2 weeks (around 08/07/2017) for HTN/Cellulitus .   Lizbeth Bark FNP

## 2017-08-02 DIAGNOSIS — Z791 Long term (current) use of non-steroidal anti-inflammatories (NSAID): Secondary | ICD-10-CM | POA: Diagnosis not present

## 2017-08-02 DIAGNOSIS — Z87891 Personal history of nicotine dependence: Secondary | ICD-10-CM | POA: Diagnosis not present

## 2017-08-02 DIAGNOSIS — Z79899 Other long term (current) drug therapy: Secondary | ICD-10-CM | POA: Diagnosis not present

## 2017-08-02 DIAGNOSIS — Z86711 Personal history of pulmonary embolism: Secondary | ICD-10-CM | POA: Diagnosis not present

## 2017-08-02 DIAGNOSIS — F419 Anxiety disorder, unspecified: Secondary | ICD-10-CM | POA: Diagnosis not present

## 2017-08-02 DIAGNOSIS — Z9049 Acquired absence of other specified parts of digestive tract: Secondary | ICD-10-CM | POA: Diagnosis not present

## 2017-08-02 DIAGNOSIS — D689 Coagulation defect, unspecified: Secondary | ICD-10-CM | POA: Diagnosis not present

## 2017-08-02 DIAGNOSIS — M7989 Other specified soft tissue disorders: Secondary | ICD-10-CM | POA: Diagnosis not present

## 2017-08-02 DIAGNOSIS — R296 Repeated falls: Secondary | ICD-10-CM | POA: Diagnosis not present

## 2017-08-02 DIAGNOSIS — Z9181 History of falling: Secondary | ICD-10-CM | POA: Diagnosis not present

## 2017-08-02 DIAGNOSIS — Z888 Allergy status to other drugs, medicaments and biological substances status: Secondary | ICD-10-CM | POA: Diagnosis not present

## 2017-08-02 DIAGNOSIS — S81802D Unspecified open wound, left lower leg, subsequent encounter: Secondary | ICD-10-CM | POA: Diagnosis not present

## 2017-08-02 DIAGNOSIS — Z7901 Long term (current) use of anticoagulants: Secondary | ICD-10-CM | POA: Diagnosis not present

## 2017-08-02 DIAGNOSIS — M1990 Unspecified osteoarthritis, unspecified site: Secondary | ICD-10-CM | POA: Diagnosis not present

## 2017-08-02 DIAGNOSIS — S81801D Unspecified open wound, right lower leg, subsequent encounter: Secondary | ICD-10-CM | POA: Diagnosis not present

## 2017-08-02 DIAGNOSIS — Z590 Homelessness: Secondary | ICD-10-CM | POA: Diagnosis not present

## 2017-08-05 ENCOUNTER — Telehealth: Payer: Self-pay | Admitting: Family Medicine

## 2017-08-05 NOTE — Telephone Encounter (Signed)
Call placed to patient #[phone] in regards to home health referral. Need to verify her address and ask her if she has an agency of choice. No answer. Call drops after several rings.

## 2017-08-06 ENCOUNTER — Encounter (HOSPITAL_COMMUNITY): Payer: Self-pay

## 2017-08-06 ENCOUNTER — Emergency Department (HOSPITAL_COMMUNITY)
Admission: EM | Admit: 2017-08-06 | Discharge: 2017-08-06 | Disposition: A | Discharge status: Home / Self Care | Payer: Medicare Other | Attending: Emergency Medicine | Admitting: Emergency Medicine

## 2017-08-06 DIAGNOSIS — M79605 Pain in left leg: Secondary | ICD-10-CM | POA: Diagnosis not present

## 2017-08-06 DIAGNOSIS — M79661 Pain in right lower leg: Secondary | ICD-10-CM | POA: Diagnosis not present

## 2017-08-06 DIAGNOSIS — M79604 Pain in right leg: Secondary | ICD-10-CM

## 2017-08-06 DIAGNOSIS — I10 Essential (primary) hypertension: Secondary | ICD-10-CM | POA: Insufficient documentation

## 2017-08-06 DIAGNOSIS — Z79899 Other long term (current) drug therapy: Secondary | ICD-10-CM | POA: Insufficient documentation

## 2017-08-06 DIAGNOSIS — M79606 Pain in leg, unspecified: Secondary | ICD-10-CM | POA: Diagnosis not present

## 2017-08-06 DIAGNOSIS — Z87891 Personal history of nicotine dependence: Secondary | ICD-10-CM | POA: Insufficient documentation

## 2017-08-06 DIAGNOSIS — M79662 Pain in left lower leg: Secondary | ICD-10-CM | POA: Diagnosis not present

## 2017-08-06 MED ORDER — BACITRACIN ZINC 500 UNIT/GM EX OINT
TOPICAL_OINTMENT | CUTANEOUS | Status: AC
  Administered 2017-08-06: 1
  Filled 2017-08-06: qty 0.9

## 2017-08-06 NOTE — ED Provider Notes (Signed)
[facility]-EMERGENCY DEPT Provider Note   CSN: [id number] Arrival date & time: 08/06/17  1112     History   Chief Complaint Chief Complaint  Patient presents with  . muscle tremors  . Hypertension    HPI Beth Hall is a 76 y.o. female.  She is here today, because she noticed some pain in her lower legs while walking to the bus stop.  Sometimes she uses a wheelchair to help her walk.  She states that [facility] ago and at that time, she had her lower legs wrapped because of "a hole in my left leg."  She denies recent fever, chills, cough, shortness of breath, chest pain, focal weakness or paresthesia.  There are no other known modifying factors.  HPI  Past Medical History:  Diagnosis Date  . Hip fracture (HCC)   . Homeless   . Hypertension   . Morbid obesity (HCC)   . Pelvic fracture (HCC)   . Post-partum depression     Patient Active Problem List   Diagnosis Date Noted  . Pedal edema 06/03/2017  . Essential hypertension 06/03/2017  . Homelessness 06/03/2017  . Abrasions of multiple sites 06/03/2017    History reviewed. No pertinent surgical history.  OB History    No data available       Home Medications    Prior to Admission medications   Medication Sig Start Date End Date Taking? Authorizing Provider  acetaminophen (TYLENOL) 500 MG tablet Take 1 tablet (500 mg total) by mouth every 6 (six) hours as needed. Patient taking differently: Take 500 mg by mouth every 6 (six) hours as needed.  05/05/17   Horton, Mayer Maskerourtney F, MD  albuterol (PROVENTIL HFA;VENTOLIN HFA) 108 (90 Base) MCG/ACT inhaler Inhale 2 puffs into the lungs every 4 (four) hours as needed for wheezing or shortness of breath. Patient not taking: Reported on 06/10/2017 06/03/17   Quentin AngstJegede, Olugbemiga E, MD  amLODipine (NORVASC) 10 MG tablet Take 1 tablet (10 mg total) by mouth daily. 07/24/17   Lizbeth BarkHairston, Mandesia R, FNP   docusate sodium (COLACE) 100 MG capsule Take 1 capsule (100 mg total) by mouth every 12 (twelve) hours. 06/03/17   Quentin AngstJegede, Olugbemiga E, MD  furosemide (LASIX) 20 MG tablet Take 1 tablet (20 mg total) by mouth daily. 06/10/17   Horton, Mayer Maskerourtney F, MD  hydrochlorothiazide (HYDRODIURIL) 12.5 MG tablet Take 1 tablet (12.5 mg total) by mouth daily. 07/24/17   Lizbeth BarkHairston, Mandesia R, FNP  mupirocin ointment (BACTROBAN) 2 % Apply 1 application topically 3 (three) times daily. 06/03/17   Quentin AngstJegede, Olugbemiga E, MD  senna (SENOKOT) 8.6 MG tablet Take 8.6 mg by mouth every evening. 09/19/16   [provider]  triamcinolone cream (KENALOG) 0.1 % Apply 1 application topically 4 (four) times daily as needed. Patient taking differently: Apply 1 application topically 4 (four) times daily as needed (rash).  05/01/17   Pricilla LovelessGoldston, Scott, MD    Family History Family History  Problem Relation Age of Onset  . Heart failure Mother   . Heart failure Father     Social History Social History  Substance Use Topics  . Smoking status: Former Smoker    Quit date: 11/09/2013  . Smokeless tobacco: Never Used  . Alcohol use No     Allergies   Diazepam and Haldol [haloperidol lactate]   Review of Systems Review of Systems  All other systems reviewed and are negative.  Physical Exam Updated Vital Signs BP (!) 180/66 (BP Location: Right Arm)   Pulse (!) 53   Temp 97.8 F (36.6 C) (Oral)   Resp 16   Ht 5' 2.5" (1.588 m)   Wt 122.9 kg (271 lb)   SpO2 100%   BMI 48.78 kg/m   Physical Exam  Constitutional: She is oriented to person, place, and time. She appears well-developed. No distress.  Obese, alert, cooperative.  HENT:  Head: Normocephalic and atraumatic.  Eyes: Pupils are equal, round, and reactive to light. Conjunctivae and EOM are normal.  Neck: Normal range of motion and phonation normal. Neck supple.  Cardiovascular: Normal rate and regular rhythm.   Pulmonary/Chest: Effort normal and  breath sounds normal. She exhibits no tenderness.  Abdominal: Soft. She exhibits no distension. There is no tenderness. There is no guarding.  Musculoskeletal: Normal range of motion.  Mild lower leg swelling, bilaterally.  Healing wound left mid anterior shin, closed over without associated bleeding, drainage or fluctuance.  Neurological: She is alert and oriented to person, place, and time. She exhibits normal muscle tone.  Skin: Skin is warm and dry.  Psychiatric: She has a normal mood and affect. Her behavior is normal. Judgment and thought content normal.  Nursing note and vitals reviewed.    ED Treatments / Results  Labs (all labs ordered are listed, but only abnormal results are displayed) Labs Reviewed - No data to display  EKG  EKG Interpretation None       Radiology No results found.  Procedures Procedures (including critical care time)  Medications Ordered in ED Medications - No data to display   Initial Impression / Assessment and Plan / ED Course  I have reviewed the triage vital signs and the nursing notes.  Pertinent labs & imaging results that were available during my care of the patient were reviewed by me and considered in my medical decision making (see chart for details).      Patient Vitals for the past 24 hrs:  BP Temp Temp src Pulse Resp SpO2 Height Weight  08/06/17 1757 (!) 180/66 97.8 F (36.6 C) Oral (!) 53 16 100 % - -  08/06/17 1513 (!) 173/53 98 F (36.7 C) Oral (!) 51 20 96 % - -  08/06/17 1147 (!) 179/122 - - - - - - -  08/06/17 1128 (!) 162/123 98.1 F (36.7 C) Oral 65 20 100 % 5' 2.5" (1.588 m) 122.9 kg (271 lb)    6:12 PM Reevaluation with update and discussion. After initial assessment and treatment, an updated evaluation reveals no change in clinical status.  Findings discussed with the patient and all questions answered. Nicolas Banh L    Final Clinical Impressions(s) / ED Diagnoses   Final diagnoses:  Pain in both lower  extremities   Nonspecific lower leg pain.  Patient with numerous ED visits for various complaints.  No current sign of fracture, cellulitis, tendinitis, large joint infection or arthritis, or suggestion for metabolic or infectious process.  Nursing Notes Reviewed/ Care Coordinated Applicable Imaging Reviewed Interpretation of Laboratory Data incorporated into ED treatment  The patient appears reasonably screened and/or stabilized for discharge and I doubt any other medical condition or other [facility] requiring further screening, evaluation, or treatment in the ED at this time prior to discharge.  Plan: Home Medications-continue current medications, use Tylenol for pain; Home Treatments-rest, heat applications if needed; return here if the recommended treatment, does not improve the symptoms; Recommended follow up-PCP, as needed New Prescriptions  New Prescriptions   No medications on file     Mancel Bale, MD 08/06/17 [phone]

## 2017-08-06 NOTE — ED Triage Notes (Signed)
Per EMS- Patient is from [facility]. Patient c/o muscle tremors in both legs.

## 2017-08-06 NOTE — Discharge Instructions (Signed)
Use Tylenol if needed, for pain.  See your doctor for all medical problems, unless they are emergencies.

## 2017-08-06 NOTE — ED Notes (Signed)
Pt verbalized understanding of discharge instructions.

## 2017-08-06 NOTE — ED Triage Notes (Signed)
Patient states she had to walk to the bus stop today from [facility] and is now having muscle tremors nd aching.

## 2017-08-07 NOTE — Telephone Encounter (Signed)
Tried to reach patient [phone] again regarding the home health referral placed. No answer. Call drops after several rings.

## 2017-08-10 NOTE — Telephone Encounter (Signed)
Attempted to contact the patient to discuss the home health referral. Call placed to # [phone] [facility] and the call dropped after 9 rings. Unable to leave a message.

## 2017-08-14 ENCOUNTER — Telehealth: Payer: Self-pay | Admitting: Family Medicine

## 2017-08-14 NOTE — Telephone Encounter (Signed)
Call placed to patient # [phone] regarding home health. No answer. After several ring the call drops.

## 2017-08-17 ENCOUNTER — Other Ambulatory Visit: Payer: Self-pay | Admitting: Family Medicine

## 2017-08-17 ENCOUNTER — Ambulatory Visit (HOSPITAL_BASED_OUTPATIENT_CLINIC_OR_DEPARTMENT_OTHER): Payer: Medicare Other | Admitting: Nurse Practitioner

## 2017-08-17 ENCOUNTER — Telehealth: Payer: Self-pay

## 2017-08-17 ENCOUNTER — Encounter: Payer: Self-pay | Admitting: Nurse Practitioner

## 2017-08-17 ENCOUNTER — Emergency Department (HOSPITAL_COMMUNITY)
Admission: EM | Admit: 2017-08-17 | Discharge: 2017-08-17 | Disposition: A | Discharge status: Home / Self Care | Payer: Medicare Other | Attending: Emergency Medicine | Admitting: Emergency Medicine

## 2017-08-17 ENCOUNTER — Ambulatory Visit: Payer: Medicare Other | Admitting: Licensed Clinical Social Worker

## 2017-08-17 VITALS — BP 135/79 | HR 78 | Temp 98.2°F

## 2017-08-17 DIAGNOSIS — Z79899 Other long term (current) drug therapy: Secondary | ICD-10-CM

## 2017-08-17 DIAGNOSIS — R404 Transient alteration of awareness: Secondary | ICD-10-CM | POA: Diagnosis not present

## 2017-08-17 DIAGNOSIS — M79606 Pain in leg, unspecified: Secondary | ICD-10-CM

## 2017-08-17 DIAGNOSIS — F419 Anxiety disorder, unspecified: Secondary | ICD-10-CM

## 2017-08-17 DIAGNOSIS — R531 Weakness: Secondary | ICD-10-CM

## 2017-08-17 DIAGNOSIS — I10 Essential (primary) hypertension: Secondary | ICD-10-CM | POA: Insufficient documentation

## 2017-08-17 DIAGNOSIS — Z5900 Homelessness unspecified: Secondary | ICD-10-CM

## 2017-08-17 DIAGNOSIS — R600 Localized edema: Secondary | ICD-10-CM | POA: Diagnosis not present

## 2017-08-17 DIAGNOSIS — Z87891 Personal history of nicotine dependence: Secondary | ICD-10-CM | POA: Insufficient documentation

## 2017-08-17 DIAGNOSIS — R609 Edema, unspecified: Secondary | ICD-10-CM | POA: Diagnosis not present

## 2017-08-17 DIAGNOSIS — R29898 Other symptoms and signs involving the musculoskeletal system: Secondary | ICD-10-CM

## 2017-08-17 LAB — I-STAT CHEM 8, ED
BUN: 20 mg/dL (ref 6–20)
CALCIUM ION: 1.18 mmol/L (ref 1.15–1.40)
Chloride: 107 mmol/L (ref 101–111)
Creatinine, Ser: 1 mg/dL (ref 0.44–1.00)
GLUCOSE: 104 mg/dL — AB (ref 65–99)
HCT: 38 % (ref 36.0–46.0)
HEMOGLOBIN: 12.9 g/dL (ref 12.0–15.0)
Potassium: 4.4 mmol/L (ref 3.5–5.1)
Sodium: 143 mmol/L (ref 135–145)
TCO2: 26 mmol/L (ref 22–32)

## 2017-08-17 MED ORDER — SILVER SULFADIAZINE 1 % EX CREA
TOPICAL_CREAM | Freq: Once | CUTANEOUS | Status: AC
  Administered 2017-08-17: 04:00:00 via TOPICAL
  Filled 2017-08-17: qty 50

## 2017-08-17 NOTE — Telephone Encounter (Signed)
Call received from Christa See, [place] stating that the patient was wheeled  to the [facility] by hospital staff and was seen by Malachi Pro, NP.  She noted that the patient had been seen in [facility] ED earlier this morning.  She took a bus to [facility] and was not able to propel herself in her wheelchair to get to the clinic after getting off of the bus, so the hospital staff assisted her.  She currently explained to [facility] that she has no money until 08/20/17 and has no place to live. She explained that she is not able to go back to the [facility], where she had been staying because she is not able to care for herself. She has no shelter tonight.    Call placed to [facility], [place] regarding the patient's lack of shelter. She referred this CM to [place], Surveyor, quantity [place]. Maggie stated that this CM needed to speak to Daryel Gerald, [place] to obtain approval for the patient to return to their facility.  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  He stated that she even needs assistance with moving her wheelchair, He explained that they have tried to work with the patient for over a year to secure housing; but she will not co-operate.  She has an Warehouse manager > $600.00 that needs to be paid in order to obtain the housing and she refuses to pay the bill.  She does receive a monthly income. Explained that for medicare to cover a rehab stay she needs to be in the hospital 3 nights and is in need to skilled care.  If she is to be placed in a facility from the community, she would have to relinquish her monthly check and at this point, she has not wanted to do that.  He was agreeable to allowing the patient to stay at [facility] but she must agree to meet with him in the morning.  Christa See, LCSW/CHWC met with the  patient and explained that she can go to the [facility] but must meet with Daryel Gerald in the morning. She eventually agreed.  Call placed to Daryel Gerald, [phone] and informed him that the patient has agreed to [facility] and meet with him in the morning. He stated that he made a referral to DSS in the past and will need to follow up with the status of the referral. He requested that [facility] make a referral to DSS/APS to express the concerns regarding the patient's ability to manage her own care and her lack of housing   Update provided to Fredia Beets, [place] who agreed to contact APS.

## 2017-08-17 NOTE — Discharge Instructions (Signed)
Keep your legs elevated as much as possible.  Avoid processed or salty foods as this may contribute to worsening leg swelling.  Take your Lasix as prescribed to prevent buildup of excess fluid.  You may take Tylenol as needed for pain management.  Follow-up with your primary care doctor regarding your visit today.

## 2017-08-17 NOTE — ED Notes (Signed)
Bed: [id number] Expected date:  Expected time:  Means of arrival:  Comments: 76 yo F/ Swelling in legs

## 2017-08-17 NOTE — ED Provider Notes (Signed)
[facility]-EMER[facility] DEPT Provider Note   CSN: [id number] Arrival date & time: 08/17/17  0051    History   Chief Complaint Chief Complaint  Patient presents with  . Leg Swelling    bilateral    HPI Beth Hall is a 76 y.o. female.  76 year old female with a history of hypertension and homelessness presents to the emergency department today for leg swelling.  She states that she has been having her legs wrapped before a wound and they felt too tight tonight.  She called for the medics to come out and unwrap her legs, but states they were unable to redress her wounds. She notes some pain to her BLE. No fevers. She has been seen numerous times in various EDs for complaints of BLE swelling and pain.   The history is provided by the patient. No language interpreter was used.    Past Medical History:  Diagnosis Date  . Hip fracture (HCC)   . Homeless   . Hypertension   . Morbid obesity (HCC)   . Pelvic fracture (HCC)   . Post-partum depression     Patient Active Problem List   Diagnosis Date Noted  . Pedal edema 06/03/2017  . Essential hypertension 06/03/2017  . Homelessness 06/03/2017  . Abrasions of multiple sites 06/03/2017    No past surgical history on file.  OB History    No data available       Home Medications    Prior to Admission medications   Medication Sig Start Date End Date Taking? Authorizing Provider  acetaminophen (TYLENOL) 500 MG tablet Take 1 tablet (500 mg total) by mouth every 6 (six) hours as needed. Patient taking differently: Take 500 mg by mouth every 6 (six) hours as needed.  05/05/17   Horton, Mayer Masker, MD  albuterol (PROVENTIL HFA;VENTOLIN HFA) 108 (90 Base) MCG/ACT inhaler Inhale 2 puffs into the lungs every 4 (four) hours as needed for wheezing or shortness of breath. Patient not taking: Reported on 06/10/2017 06/03/17   Quentin Angst, MD  amLODipine (NORVASC) 10 MG tablet Take 1 tablet (10 mg total) by  mouth daily. 07/24/17   Lizbeth Bark, FNP  docusate sodium (COLACE) 100 MG capsule Take 1 capsule (100 mg total) by mouth every 12 (twelve) hours. 06/03/17   Quentin Angst, MD  furosemide (LASIX) 20 MG tablet Take 1 tablet (20 mg total) by mouth daily. 06/10/17   Horton, Mayer Masker, MD  hydrochlorothiazide (HYDRODIURIL) 12.5 MG tablet Take 1 tablet (12.5 mg total) by mouth daily. 07/24/17   Lizbeth Bark, FNP  mupirocin ointment (BACTROBAN) 2 % Apply 1 application topically 3 (three) times daily. 06/03/17   Quentin Angst, MD  senna (SENOKOT) 8.6 MG tablet Take 8.6 mg by mouth every evening. 09/19/16   [provider]  triamcinolone cream (KENALOG) 0.1 % Apply 1 application topically 4 (four) times daily as needed. Patient taking differently: Apply 1 application topically 4 (four) times daily as needed (rash).  05/01/17   Pricilla Loveless, MD    Family History Family History  Problem Relation Age of Onset  . Heart failure Mother   . Heart failure Father     Social History Social History  Substance Use Topics  . Smoking status: Former Smoker    Quit date: 11/09/2013  . Smokeless tobacco: Never Used  . Alcohol use No     Allergies   Diazepam and Haldol [haloperidol lactate]   Review of Systems Review of  Systems Ten systems reviewed and are negative for acute change, except as noted in the HPI.    Physical Exam Updated Vital Signs BP (!) 148/75 (BP Location: Right Arm)   Pulse 70   Resp 16   SpO2 96%   Physical Exam  Constitutional: She is oriented to person, place, and time. She appears well-developed and well-nourished. No distress.  Nontoxic appearing and in NAD. Obese.  HENT:  Head: Normocephalic and atraumatic.  Eyes: Conjunctivae and EOM are normal. No scleral icterus.  Neck: Normal range of motion.  Cardiovascular: Normal rate, regular rhythm and intact distal pulses.   DP pulse palpable in BLE  Pulmonary/Chest: Effort normal. No  respiratory distress.  Respirations even and unlabored  Musculoskeletal: Normal range of motion.  Edema to BLE  Neurological: She is alert and oriented to person, place, and time. She exhibits normal muscle tone. Coordination normal.  Sensation to light touch intact in BLE.  Skin: Skin is warm and dry. No rash noted. She is not diaphoretic. No erythema. No pallor.  Psychiatric: She has a normal mood and affect. Her behavior is normal.  Nursing note and vitals reviewed.    ED Treatments / Results  Labs (all labs ordered are listed, but only abnormal results are displayed) Labs Reviewed  I-STAT CHEM 8, ED - Abnormal; Notable for the following:       Result Value   Glucose, Bld 104 (*)    All other components within normal limits    EKG  EKG Interpretation None       Radiology No results found.   Per Care Everywhere - Study 07/12/17 IMPRESSION: No evidence of deep venous thrombosis in the bilateral lower extremities.  Electronically Signed by: Lambert Keto  Result Narrative  INDICATION: Leg swelling  TECHNIQUE: Sonogram of the bilateral lower extremity venous systems was performed assessing grayscale imaging, color Doppler supplementation, and compressibility.  FINDINGS:  The bilateral great saphenous, common femoral, superficial femoral, and popliteal veins demonstrate normal color flow and compressibility. There is flow augmentation with appropriate maneuvers.  Other Result Information  Acute Interface, Incoming Rad Results - 07/12/2017  1:31 AM EDT INDICATION: Leg swelling  TECHNIQUE: Sonogram of the bilateral lower extremity venous systems was performed assessing grayscale imaging, color Doppler supplementation, and compressibility.  FINDINGS:  The bilateral great saphenous, common femoral, superficial femoral, and popliteal veins demonstrate normal color flow and compressibility. There is flow augmentation with appropriate maneuvers.   IMPRESSION: No  evidence of deep venous thrombosis in the bilateral lower extremities.  Electronically Signed by: Lambert Keto    Procedures Procedures (including critical care time)  Medications Ordered in ED Medications  silver sulfADIAZINE (SILVADENE) 1 % cream ( Topical Given 08/17/17 0333)     Initial Impression / Assessment and Plan / ED Course  I have reviewed the triage vital signs and the nursing notes.  Pertinent labs & imaging results that were available during my care of the patient were reviewed by me and considered in my medical decision making (see chart for details).     76 year old female presents to the emergency department for leg swelling.  She has been seen a number of times previously for leg pain or swelling (8 times in the past month).  She has a history of negative DVT studies 1 month ago. Chemistry panel and H/H is reassuring. Vitals stable. Patient with bilateral pitting edema. No open sores or drainage. No heat to touch, red linear streaking. No concern for secondary infection or cellulitis.  ACE wrap used to apply compression to BLE which patient had on previously. She has been instructed to follow up with a primary care physician. Return precautions provided. Patient discharged in stable condition with no unaddressed concerns.   Final Clinical Impressions(s) / ED Diagnoses   Final diagnoses:  Peripheral edema    New Prescriptions New Prescriptions   No medications on file     Antony MaduraHumes, Renita Brocks, PA-C 08/17/17 0547    Ward, Layla MawKristen N, DO 08/17/17 [phone]

## 2017-08-17 NOTE — ED Triage Notes (Signed)
Pt complaining of bilateral leg swelling and pain in the right leg. Pt has chronic cellulitis. Pt requesting to have legs rewrapped.   130/84 HR 88 Resp 18 CBG 159

## 2017-08-17 NOTE — Patient Instructions (Addendum)
Edema Edema is an abnormal buildup of fluids in your bodytissues. Edema is somewhatdependent on gravity to pull the fluid to the lowest place in your body. That makes the condition more common in the legs and thighs (lower extremities). Painless swelling of the feet and ankles is common and becomes more likely as you get older. It is also common in looser tissues, like around your eyes. When the affected area is squeezed, the fluid may move out of that spot and leave a dent for a few moments. This dent is called pitting. What are the causes? There are many possible causes of edema. Eating too much salt and being on your feet or sitting for a long time can cause edema in your legs and ankles. Hot weather may make edema worse. Common medical causes of edema include:  Heart failure.  Liver disease.  Kidney disease.  Weak blood vessels in your legs.  Cancer.  An injury.  Pregnancy.  Some medications.  Obesity.  What are the signs or symptoms? Edema is usually painless.Your skin may look swollen or shiny. How is this diagnosed? Your health care provider may be able to diagnose edema by asking about your medical history and doing a physical exam. You may need to have tests such as X-rays, an electrocardiogram, or blood tests to check for medical conditions that may cause edema. How is this treated? Edema treatment depends on the cause. If you have heart, liver, or kidney disease, you need the treatment appropriate for these conditions. General treatment may include:  Elevation of the affected body part above the level of your heart.  Compression of the affected body part. Pressure from elastic bandages or support stockings squeezes the tissues and forces fluid back into the blood vessels. This keeps fluid from entering the tissues.  Restriction of fluid and salt intake.  Use of a water pill (diuretic). These medications are appropriate only for some types of edema. They pull fluid  out of your body and make you urinate more often. This gets rid of fluid and reduces swelling, but diuretics can have side effects. Only use diuretics as directed by your health care provider.  Follow these instructions at home:  Keep the affected body part above the level of your heart when you are lying down.  Do not sit still or stand for prolonged periods.  Do not put anything directly under your knees when lying down.  Do not wear constricting clothing or garters on your upper legs.  Exercise your legs to work the fluid back into your blood vessels. This may help the swelling go down.  Wear elastic bandages or support stockings to reduce ankle swelling as directed by your health care provider.  Eat a low-salt diet to reduce fluid if your health care provider recommends it.  Only take medicines as directed by your health care provider. Contact a health care provider if:  Your edema is not responding to treatment.  You have heart, liver, or kidney disease and notice symptoms of edema.  You have edema in your legs that does not improve after elevating them.  You have sudden and unexplained weight gain. Get help right away if:  You develop shortness of breath or chest pain.  You cannot breathe when you lie down.  You develop pain, redness, or warmth in the swollen areas.  You have heart, liver, or kidney disease and suddenly get edema.  You have a fever and your symptoms suddenly get worse. This information is   not intended to replace advice given to you by your health care provider. Make sure you discuss any questions you have with your health care provider. Document Released: 10/06/2005 Document Revised: 03/13/2016 Document Reviewed: 07/29/2013 Elsevier Interactive Patient Education  2017 Elsevier Inc.  Deconditioning Deconditioning refers to the changes in your body that occur during a period of inactivity. The changes happen in your heart, lungs, and muscles. They  decrease your ability to be active, and they make you feel tired and weak. There are three stages of deconditioning:  Mild deconditioning. At this stage, you will notice a change in your ability to do your usual exercise activities, such as running, biking, or swimming.  Moderate deconditioning. At this stage, you will notice a change in your ability to do normal everyday activities, such as walking, grocery shopping, and doing chores.  Severe deconditioning. At this stage, you will notice a change in your ability to do minimal activity or normal self-care.  Deconditioning can occur after only a few days of inactivity. The longer the period of inactivity, the more severe the deconditioning will be, and the longer it will take to return to your previous level of functioning. What are the causes? Deconditioning is often caused by inactivity due to:  Illnesses, such as cancer, stroke, heart attack, fibromyalgia, and chronic fatigue syndrome.  Injuries, especially back injuries, broken bones, and ligament and tendon injuries.  A long stay in the hospital.  Pregnancy, especially if long periods of bed rest are needed.  What increases the risk? This condition is more likely to develop in:  People who are hospitalized.  People on bed rest.  People who are obese.  People with poor nutrition.  Elderly adults.  People with injuries or illnesses that interfere with movement and activity.  What are the signs or symptoms? Symptoms of deconditioning include:  Weakness.  Tiredness.  Shortness of breath with minor exertion.  A faster-than-normal heartbeat. You may not notice this without taking your pulse.  Pain or discomfort with activity.  Decreased strength.  Decreased sense of balance.  Decreased endurance.  Difficulty doing your usual forms of exercise.  Difficulty doing activities of daily living, such as grocery shopping or chores.  Difficulty walking around the  house and doing basic self-care, such as getting to the bathroom, preparing meals, or doing laundry.  How is this diagnosed? Deconditioning is diagnosed based on your medical history and a physical exam. During the physical exam, your health care provider will check for signs of deconditioning, such as:  Decreased size of muscles.  Decreased strength.  Trouble with balance.  Shortness of breath or abnormally increased heart rate after minor exertion.  How is this treated? Treatment for deconditioning usually involves following a structured exercise program in which activity is increased gradually. Your health care provider will determine which exercises are right for you. The exercise program will likely include aerobic exercise and strength training:  Aerobic exercise helps improve the functioning of the heart and lungs as well as the muscles.  Strength training helps improve muscle size and strength.  Both of these types of exercise will improve your endurance. You may be referred to a physical therapist who can create a safe strengthening program for you to follow. Follow these instructions at home:  Follow the exercise program that is recommended by your health care provider or physical therapist.  Do not increase your exercise any faster than directed.  Eat a healthy diet.  Do not use any products  that contain nicotine or tobacco, such as cigarettes and e-cigarettes. If you need help quitting, ask your health care provider.  Take over-the-counter and prescription medicines only as told by your health care provider.  Keep all follow-up visits as told by your health care provider. This is important. Contact a health care provider if:  You are not able to carry out the prescribed exercise program.  You are becoming more and more fatigued and weak.  You become light-headed when rising to a sitting or standing position.  Your level of endurance decreases after it has  improved. Get help right away if:  You have chest pain.  You are very short of breath.  You have any episodes of passing out. This information is not intended to replace advice given to you by your health care provider. Make sure you discuss any questions you have with your health care provider. Document Released: 02/20/2014 Document Revised: 04/25/2016 Document Reviewed: 01/05/2016 Elsevier Interactive Patient Education  [employer].
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
## 2017-08-18 ENCOUNTER — Other Ambulatory Visit: Payer: Self-pay | Admitting: Family Medicine

## 2017-08-18 ENCOUNTER — Telehealth: Payer: Self-pay

## 2017-08-18 DIAGNOSIS — Z5900 Homelessness unspecified: Secondary | ICD-10-CM

## 2017-08-18 DIAGNOSIS — Z9189 Other specified personal risk factors, not elsewhere classified: Secondary | ICD-10-CM
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
## 2017-08-18 NOTE — Telephone Encounter (Signed)
Call received from Devoria Albe, [place].  She stated that she received the updated information about the patient's status from [facility]. Lanae explained that she is well aware of the patient's status and has spoken to the patient many times about housing and rehab issues and the patient has rejected all ideas.  Lanae explained that she and Daryel Gerald, [employer] have met with the patient together to discuss her issues and they have been unable to establish a plan for the patient that she will agree to.  Lanae stated that they are not able to force her to do anything. They can only provide resources and encouragement.  She inquired if [facility] had any behavioral health information about the patient. Informed her that [facility] does not have behavioral health information.  She confirmed that the patient does not have medicaid.  She does not qualify for [employer] at this time.  She has not been willing to relinquish part of her monthly check to a nursing facility for rehab.  Lanae is requesting an FL2 to be on file as she will be able to submit to facilities for review if the patient changes her mind. She will fax the [facility] request to [facility].  The first of the month is approaching and the patient could consider relinquishing part of her check for  1 month of rehab  instead of paying for motels as she has done in the past.

## 2017-08-18 NOTE — Telephone Encounter (Signed)
Call received from Fidel LevyBrandi Thomas, APS.  She was not able to confirm that a referral had already been made or that they have received [facility] that [facility].  This CM provided her with an update of the patient's current housing situation - residing at shelter and lack of permanent housing, the efforts that [facility] has made in attempts to help the patient secure an apartment and the patient's refusal to pay a utility bill that would allow her to obtain an apartment. The date when the patient must leave [facility] has not been confirmed.  The patient did state that she has no money at this time but will have money as of 08/20/17 when she receives her check.   This CM also explained that the patient is not able to manage her personal care needs and wheelchair mobility. In addition, she has dressings on her legs that she is not able to manage.  She has refused short term rehabilitation from the community as she does not want to relinquish most of her monthly check.  This CM is not able to provide any information about possible behavioral health issues.   Ms Maisie Fushomas stated that she would share the information from this call with their SW who may be able to  assist with resources for housing and information about other shelter options. She noted that the patient may be eligible for [employer] Person program for housing/supportive care assistance. This CM provided authorization for their SW to contact this CM

## 2017-08-19 NOTE — Telephone Encounter (Signed)
Noted. Thanks.

## 2017-08-19 NOTE — Telephone Encounter (Signed)
Completed FL2 faxed to Lebron ConnersLanae Anderson - DSS.    Call then received from Lanae stating that she did not receive the whole fax and they are experiencing problems with their fax machine. She stated that she will come to [facility] to pick up the document.   Copy of the FL2 and visit note  left with front desk for  [employer]anae Anderson.

## 2017-08-19 NOTE — BH Specialist Note (Signed)
Integrated Behavioral Health Follow Up Visit  MRN: [id number] Name: Beth Hall  Number of Integrated Behavioral Health Clinician visits: 4/6 Session Start time: 11:30 AM  Session End time: 12:30 AM Total time: 1 hour  Type of Service: Integrated Behavioral Health- Individual/Family Interpretor:No. Interpretor Name and Language: N/A  SUBJECTIVE: Beth Hall is a 76 y.o. female accompanied by self  Patient was referred by NP Meredeth IdeFleming for homelessness. Patient reports the following symptoms/concerns: homelessness Duration of problem: 3 months; Severity of problem: NA  OBJECTIVE: Mood: Anxious and Depressed and Affect: Appropriate Risk of harm to self or others: No plan to harm self or others  LIFE CONTEXT: Family and Social: Pt is currently homeless. She [facility] [address]. School/Work: Pt receives [employer] [phone]($[phone]) and Medicare.  Self-Care: Pt reports that she enjoys reading the bible and writing to cope with stressors Life Changes: Pt is homeless and has limited support  GOALS ADDRESSED: Patient will: 1.  Reduce symptoms of: agitation and anxiety  2.  Increase knowledge and/or ability of: coping skills  3.  Demonstrate ability to: Increase adequate support systems for patient/family and Increase motivation to adhere to plan of care  INTERVENTIONS: Interventions utilized:  Solution-Focused Strategies and Supportive Counseling Standardized Assessments completed: Patient declined screening  ASSESSMENT: Patient currently experiencing anxiety and agitation triggered by current homelessness.   Patient presented to visit with a letter written by Tommi EmeryMichael Pearson, Director [facility]. The letter stated that pt is not appropriate for shelter housing due to inability to care for self. Pt reported that she does not have anywhere to reside and will not have any money until the first of November. She is interested in SNF placement.    LCSWA contacted RN CM to consult about possible housing options for pt. RN CM made contact with Mr. Sharol Givenearson, who was agreeable to pt returning to shelter to discuss housing options. Pt verbalized that she has a strained relationship with staff at shelter resulting in limited trust. LCSWA discussed importance of strengthening support systems to assist with housing needs, in addition, to ways to effectively communicate feelings to others. [facility] and was provided transportation. She denies the need for a payee to assist with management of finances and is not interested in participating in psychotherapy or medication management.    PLAN: 1. Follow up with behavioral health clinician on : Pt was encouraged tocontact LCSWA if symptoms worsen or fail to improveto schedule [facility] appointments [facility]. 2. Behavioral recommendations: LCSWA recommends that pt apply healthy coping skills discussed and utilize provided resources. Pt is encouraged to schedule follow up appointment with LCSWA 3. Referral(s): [employer]:  Housing and Transportation 4. "From scale of 1-10, how likely are you to follow plan?": 8/10  Bridgett LarssonJasmine D Lewis, LCSW 08/19/17 2:52 PM

## 2017-08-20 ENCOUNTER — Telehealth: Payer: Self-pay | Admitting: Family Medicine

## 2017-08-20 NOTE — Telephone Encounter (Signed)
Call placed to Tommi EmeryMichael Pearson, [facility], # [phone], regarding a letter that he had written for patient. Call and left him a message asking him if he could re write the letter the patient's complete name and fax it to us. Left both office number # [phone] and fax number #[phone] on the message as well.

## 2017-08-29 ENCOUNTER — Emergency Department (HOSPITAL_COMMUNITY)
Admission: EM | Admit: 2017-08-29 | Discharge: 2017-08-29 | Disposition: A | Discharge status: Home / Self Care | Payer: Medicare Other | Attending: Emergency Medicine | Admitting: Emergency Medicine

## 2017-08-29 ENCOUNTER — Other Ambulatory Visit: Payer: Self-pay

## 2017-08-29 ENCOUNTER — Encounter (HOSPITAL_COMMUNITY): Payer: Self-pay | Admitting: Emergency Medicine

## 2017-08-29 DIAGNOSIS — Z87891 Personal history of nicotine dependence: Secondary | ICD-10-CM | POA: Diagnosis not present

## 2017-08-29 DIAGNOSIS — R109 Unspecified abdominal pain: Secondary | ICD-10-CM | POA: Diagnosis not present

## 2017-08-29 DIAGNOSIS — M79604 Pain in right leg: Secondary | ICD-10-CM | POA: Diagnosis not present

## 2017-08-29 DIAGNOSIS — G8929 Other chronic pain: Secondary | ICD-10-CM

## 2017-08-29 DIAGNOSIS — Z79899 Other long term (current) drug therapy: Secondary | ICD-10-CM | POA: Insufficient documentation

## 2017-08-29 DIAGNOSIS — R195 Other fecal abnormalities: Secondary | ICD-10-CM

## 2017-08-29 DIAGNOSIS — Z590 Homelessness: Secondary | ICD-10-CM | POA: Diagnosis not present

## 2017-08-29 DIAGNOSIS — I10 Essential (primary) hypertension: Secondary | ICD-10-CM | POA: Diagnosis not present

## 2017-08-29 DIAGNOSIS — M79605 Pain in left leg: Secondary | ICD-10-CM | POA: Diagnosis not present

## 2017-08-29 DIAGNOSIS — R600 Localized edema: Secondary | ICD-10-CM | POA: Diagnosis not present

## 2017-08-29 LAB — URINALYSIS, ROUTINE W REFLEX MICROSCOPIC
BILIRUBIN URINE: NEGATIVE
GLUCOSE, UA: NEGATIVE mg/dL
HGB URINE DIPSTICK: NEGATIVE
KETONES UR: NEGATIVE mg/dL
Leukocytes, UA: NEGATIVE
Nitrite: NEGATIVE
PROTEIN: NEGATIVE mg/dL
Specific Gravity, Urine: 1.016 (ref 1.005–1.030)
pH: 6 (ref 5.0–8.0)

## 2017-08-29 LAB — CBC WITH DIFFERENTIAL/PLATELET
Basophils Absolute: 0 10*3/uL (ref 0.0–0.1)
Basophils Relative: 0 %
EOS PCT: 4 %
Eosinophils Absolute: 0.2 10*3/uL (ref 0.0–0.7)
HEMATOCRIT: 36.9 % (ref 36.0–46.0)
Hemoglobin: 11.7 g/dL — ABNORMAL LOW (ref 12.0–15.0)
LYMPHS ABS: 1.7 10*3/uL (ref 0.7–4.0)
LYMPHS PCT: 30 %
MCH: 26.2 pg (ref 26.0–34.0)
MCHC: 31.7 g/dL (ref 30.0–36.0)
MCV: 82.6 fL (ref 78.0–100.0)
Monocytes Absolute: 0.5 10*3/uL (ref 0.1–1.0)
Monocytes Relative: 8 %
Neutro Abs: 3.3 10*3/uL (ref 1.7–7.7)
Neutrophils Relative %: 58 %
PLATELETS: 239 10*3/uL (ref 150–400)
RBC: 4.47 MIL/uL (ref 3.87–5.11)
RDW: 16.4 % — ABNORMAL HIGH (ref 11.5–15.5)
WBC: 5.6 10*3/uL (ref 4.0–10.5)

## 2017-08-29 LAB — COMPREHENSIVE METABOLIC PANEL
ALK PHOS: 95 U/L (ref 38–126)
ALT: 17 U/L (ref 14–54)
AST: 25 U/L (ref 15–41)
Albumin: 3.4 g/dL — ABNORMAL LOW (ref 3.5–5.0)
Anion gap: 6 (ref 5–15)
BILIRUBIN TOTAL: 0.7 mg/dL (ref 0.3–1.2)
BUN: 15 mg/dL (ref 6–20)
CALCIUM: 8.7 mg/dL — AB (ref 8.9–10.3)
CHLORIDE: 108 mmol/L (ref 101–111)
CO2: 26 mmol/L (ref 22–32)
CREATININE: 1.18 mg/dL — AB (ref 0.44–1.00)
GFR, EST AFRICAN AMERICAN: 51 mL/min — AB (ref >60)
GFR, EST NON AFRICAN AMERICAN: 44 mL/min — AB (ref >60)
Glucose, Bld: 94 mg/dL (ref 65–99)
Potassium: 4.7 mmol/L (ref 3.5–5.1)
Sodium: 140 mmol/L (ref 135–145)
TOTAL PROTEIN: 7.1 g/dL (ref 6.5–8.1)

## 2017-08-29 LAB — POC OCCULT BLOOD, ED: FECAL OCCULT BLD: NEGATIVE

## 2017-08-29 LAB — LIPASE, BLOOD: LIPASE: 36 U/L (ref 11–51)

## 2017-08-29 NOTE — Progress Notes (Signed)
CSW met with per request of [facility] ED secretary who placed call to ask for cab voucher for patient. Patient arrived at ED with multiple bags of personal luggage, patient came via bus but her belongings were brought here via cab.  CSW spoke with patient, identified that patient had no social supports that could arrange transportation for her. Patient stated she is homeless and utilizes her power chair for mobility. Patient was given a cab voucher and bus pass to take her and her belongings to the [place] [address].  CSW signing off.  Madilyn Fireman, MSW, LCSW-A Weekend Clinical Social Worker [phone]

## 2017-08-29 NOTE — ED Provider Notes (Addendum)
TIME SEEN: 2:07 AM  CHIEF COMPLAINT: Multiple complaints  HPI: Patient is a 76 year old female who was known to our emergency department with 20 visits in the past 6 months with history of morbid obesity, homelessness, hypertension who presents to the emergency department today with multiple complaints.  She states that approximately a month ago she injured both of her legs.  She states that she has not had any new injury.  She has chronic lower extremity pain and swelling.  She reports abdominal pain without fevers, vomiting or diarrhea.  No chest pain or shortness of breath.  Reports dark and tarry stools.  Reports urinary frequency.  No new numbness or focal weakness.  Has history of chronic urinary incontinence.  ROS: See HPI Constitutional: no fever  Eyes: no drainage  ENT: no runny nose   Cardiovascular:  no chest pain  Resp: no SOB  GI: no vomiting GU: no dysuria Integumentary: no rash  Allergy: no hives  Musculoskeletal: no leg swelling  Neurological: no slurred speech ROS otherwise negative  PAST MEDICAL HISTORY/PAST SURGICAL HISTORY:  Past Medical History:  Diagnosis Date  . Hip fracture (HCC)   . Homeless   . Hypertension   . Morbid obesity (HCC)   . Pelvic fracture (HCC)   . Post-partum depression     MEDICATIONS:  Prior to Admission medications   Medication Sig Start Date End Date Taking? Authorizing Provider  acetaminophen (TYLENOL) 500 MG tablet Take 1 tablet (500 mg total) by mouth every 6 (six) hours as needed. Patient taking differently: Take 500 mg by mouth every 6 (six) hours as needed.  05/05/17   Horton, Mayer Maskerourtney F, MD  albuterol (PROVENTIL HFA;VENTOLIN HFA) 108 (90 Base) MCG/ACT inhaler Inhale 2 puffs into the lungs every 4 (four) hours as needed for wheezing or shortness of breath. Patient not taking: Reported on 06/10/2017 06/03/17   Quentin AngstJegede, Olugbemiga E, MD  amLODipine (NORVASC) 10 MG tablet Take 1 tablet (10 mg total) by mouth daily. 07/24/17   Lizbeth BarkHairston,  Mandesia R, FNP  docusate sodium (COLACE) 100 MG capsule Take 1 capsule (100 mg total) by mouth every 12 (twelve) hours. 06/03/17   Quentin AngstJegede, Olugbemiga E, MD  furosemide (LASIX) 20 MG tablet Take 1 tablet (20 mg total) by mouth daily. 06/10/17   Horton, Mayer Maskerourtney F, MD  hydrochlorothiazide (HYDRODIURIL) 12.5 MG tablet Take 1 tablet (12.5 mg total) by mouth daily. 07/24/17   Lizbeth BarkHairston, Mandesia R, FNP  mupirocin ointment (BACTROBAN) 2 % Apply 1 application topically 3 (three) times daily. 06/03/17   Quentin AngstJegede, Olugbemiga E, MD  senna (SENOKOT) 8.6 MG tablet Take 8.6 mg by mouth every evening. 09/19/16   [provider]  triamcinolone cream (KENALOG) 0.1 % Apply 1 application topically 4 (four) times daily as needed. Patient taking differently: Apply 1 application topically 4 (four) times daily as needed (rash).  05/01/17   Pricilla LovelessGoldston, Scott, MD    ALLERGIES:  Allergies  Allergen Reactions  . Diazepam Other (See Comments)    Other Reaction: Dry mouth  . Haldol [Haloperidol Lactate] Other (See Comments)    CATATONIC EPISODES    SOCIAL HISTORY:  Social History   Tobacco Use  . Smoking status: Former Smoker    Last attempt to quit: 11/09/2013    Years since quitting: 3.8  . Smokeless tobacco: Never Used  Substance Use Topics  . Alcohol use: No    FAMILY HISTORY: Family History  Problem Relation Age of Onset  . Heart failure Mother   . Heart  failure Father     EXAM: BP (!) 163/76   Pulse 86   Temp 97.7 F (36.5 C) (Oral)   Resp 18   SpO2 97%  CONSTITUTIONAL: Alert and oriented and responds appropriately to questions.  Elderly, morbidly obese, chronically ill-appearing HEAD: Normocephalic EYES: Conjunctivae clear, pupils appear equal, EOMI ENT: normal nose; moist mucous membranes NECK: Supple, no meningismus, no nuchal rigidity, no LAD  CARD: RRR; S1 and S2 appreciated; no murmurs, no clicks, no rubs, no gallops RESP: Normal chest excursion without splinting or tachypnea; breath  sounds clear and equal bilaterally; no wheezes, no rhonchi, no rales, no hypoxia or respiratory distress, speaking full sentences ABD/GI: Normal bowel sounds; non-distended; soft, non-tender, no rebound, no guarding, no peritoneal signs, no hepatosplenomegaly RECTAL:  Normal rectal tone, no gross blood or melena, guaiac negative, no hemorrhoids appreciated, nontender rectal exam, no fecal impaction BACK:  The back appears normal and is non-tender to palpation, there is no CVA tenderness EXT: Normal ROM in all joints; non-tender to palpation; chronic edema in bilateral lower extremities with signs of venous stasis dermatitis but no active cellulitis or abscess; normal capillary refill; no cyanosis, no calf tenderness or swelling, no bony tenderness or bony deformity on exam 2+ DP pulses bilaterally    SKIN: Normal color for age and race; warm; no rash NEURO: Moves all extremities equally, reports normal sensation diffusely, cranial nerves II through XII intact, normal speech, wheelchair-bound at baseline pSYCH: The patient's mood and manner are appropriate. Grooming and personal hygiene are appropriate.  MEDICAL DECISION MAKING: Patient here with multiple different complaints.  I suspect often patient is malingering as she is homeless and likes to have somewhere to stay but we will evaluate her further with labs, urine.  She is Hemoccult negative.  Her abdominal exam is completely benign.  She has no new focal neurologic deficits.  Her vitals are normal.  She has chronic swelling and pain in her legs but no obvious sign of injury and no significant change in her swelling.  She does not appear volume overloaded otherwise.  No sign of cellulitis or abscess currently.  No sign of septic arthritis.  ED PROGRESS: Patient's labs are unremarkable.  Urine shows no sign of dehydration or infection.  Hemoglobin stable.  LFTs, lipase normal.  No leukocytosis.  I do not feel she needs imaging of her abdomen.  I feel  she is safe to be discharged.   At this time, I do not feel there is any life-threatening condition present. I have reviewed and discussed all results (EKG, imaging, lab, urine as appropriate) and exam findings with patient/family. I have reviewed nursing notes and appropriate previous records.  I feel the patient is safe to be discharged home without further emergent workup and can continue workup as an outpatient as needed. Discussed usual and customary return precautions. Patient/family verbalize understanding and are comfortable with this plan.  Outpatient follow-up has been provided if needed. All questions have been answered.      Wai Litt, Layla MawKristen N, DO 08/29/17 [id number]    Trinitie Mcgirr, Layla MawKristen N, DO 08/29/17 [id number]
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
## 2017-08-30 ENCOUNTER — Emergency Department (HOSPITAL_COMMUNITY)
Admission: EM | Admit: 2017-08-30 | Discharge: 2017-08-30 | Disposition: A | Discharge status: Home / Self Care | Payer: Medicare Other | Attending: Emergency Medicine | Admitting: Emergency Medicine

## 2017-08-30 ENCOUNTER — Encounter (HOSPITAL_COMMUNITY): Payer: Self-pay | Admitting: *Deleted

## 2017-08-30 ENCOUNTER — Other Ambulatory Visit: Payer: Self-pay

## 2017-08-30 DIAGNOSIS — Z5900 Homelessness unspecified: Secondary | ICD-10-CM

## 2017-08-30 DIAGNOSIS — N898 Other specified noninflammatory disorders of vagina: Secondary | ICD-10-CM | POA: Diagnosis present

## 2017-08-30 DIAGNOSIS — B373 Candidiasis of vulva and vagina: Secondary | ICD-10-CM | POA: Diagnosis not present

## 2017-08-30 DIAGNOSIS — Z79899 Other long term (current) drug therapy: Secondary | ICD-10-CM | POA: Insufficient documentation

## 2017-08-30 DIAGNOSIS — Z87891 Personal history of nicotine dependence: Secondary | ICD-10-CM | POA: Insufficient documentation

## 2017-08-30 DIAGNOSIS — I10 Essential (primary) hypertension: Secondary | ICD-10-CM | POA: Diagnosis not present

## 2017-08-30 DIAGNOSIS — B3731 Acute candidiasis of vulva and vagina: Secondary | ICD-10-CM

## 2017-08-30 LAB — WET PREP, GENITAL
CLUE CELLS WET PREP: NONE SEEN
SPERM: NONE SEEN
TRICH WET PREP: NONE SEEN
WBC, Wet Prep HPF POC: NONE SEEN
Yeast Wet Prep HPF POC: NONE SEEN

## 2017-08-30 MED ORDER — SENNA 8.6 MG PO TABS
1.0000 | ORAL_TABLET | Freq: Once | ORAL | Status: AC
  Administered 2017-08-30: 8.6 mg via ORAL
  Filled 2017-08-30: qty 1

## 2017-08-30 MED ORDER — NYSTATIN 100000 UNIT/GM EX POWD
Freq: Once | CUTANEOUS | Status: AC
  Administered 2017-08-30: 21:00:00 via TOPICAL
  Filled 2017-08-30: qty 15

## 2017-08-30 MED ORDER — NYSTATIN 100000 UNIT/GM EX POWD: Freq: Two times a day (BID) | CUTANEOUS | 0 refills | Status: DC

## 2017-08-30 NOTE — ED Triage Notes (Signed)
Pt c/o itching with urination.  Pt is homeless.  Pt with multiple complaints stating "Pt say I piss on myself."
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
## 2017-08-30 NOTE — Discharge Instructions (Signed)
Use nystatin powder twice daily. Please take shower often.   See your doctor.   See list of shelters to go.   Return to ER if you have worse vaginal discharge, fever, vomiting, severe abdominal pain

## 2017-08-30 NOTE — ED Notes (Addendum)
Pt also stated "I need a stool softener.  I've been in a motel since the 1st but nowhere since Friday.  [place]

## 2017-08-30 NOTE — ED Notes (Signed)
Pt laughing and talking to self in triage. Pt states she wants a pelvic exam. She states she really needs a man but a pelvic will help her.

## 2017-08-31 LAB — GC/CHLAMYDIA PROBE AMP (~~LOC~~) NOT AT ARMC
Chlamydia: NEGATIVE
Neisseria Gonorrhea: NEGATIVE

## 2017-08-31 NOTE — Progress Notes (Signed)
08/31/2017 10:14AM : CSW notified by registration that patient has been waiting in the lobby to speak with CSW. CSW spoke with patient in lobby. Patient reported that she is having issues with placement around the 10th of each month and inquired about ALF. CSW provided patient with ALF resources and inquired about patient's income to cover ALF. Patient reported that she receives approx. 1300/month and that she has medicare. CSW informed patient that medicare would not pay for ALF and that patient would need to private pay and could follow up with DSS about eligibility for [facility] term care medicaid. CSW provided patient [facility] and informed patient that they could assist patient from the community with placement. CSW provided patient with shelter list and bus pass for transportation. Patient reported that she was charging her power chair before she left the lobby. CSW signing off, provided patient with available resources.  Beth Hall, LCSWA [facility]  Clinical Social Worker [phone]

## 2017-09-02 DIAGNOSIS — M160 Bilateral primary osteoarthritis of hip: Secondary | ICD-10-CM | POA: Diagnosis not present

## 2017-09-02 DIAGNOSIS — M25551 Pain in right hip: Secondary | ICD-10-CM | POA: Diagnosis not present

## 2017-09-02 DIAGNOSIS — M25552 Pain in left hip: Secondary | ICD-10-CM | POA: Diagnosis not present

## 2017-09-04 ENCOUNTER — Telehealth: Payer: Self-pay

## 2017-09-04 ENCOUNTER — Ambulatory Visit: Payer: Medicare Other | Admitting: Family Medicine

## 2017-09-04 DIAGNOSIS — R300 Dysuria: Secondary | ICD-10-CM | POA: Diagnosis not present

## 2017-09-04 DIAGNOSIS — R1031 Right lower quadrant pain: Secondary | ICD-10-CM | POA: Diagnosis not present

## 2017-09-04 DIAGNOSIS — R350 Frequency of micturition: Secondary | ICD-10-CM | POA: Diagnosis not present

## 2017-09-04 DIAGNOSIS — R911 Solitary pulmonary nodule: Secondary | ICD-10-CM | POA: Diagnosis not present

## 2017-09-04 DIAGNOSIS — R600 Localized edema: Secondary | ICD-10-CM | POA: Diagnosis not present

## 2017-09-04 DIAGNOSIS — R011 Cardiac murmur, unspecified: Secondary | ICD-10-CM | POA: Diagnosis not present

## 2017-09-04 DIAGNOSIS — Z87891 Personal history of nicotine dependence: Secondary | ICD-10-CM | POA: Diagnosis not present

## 2017-09-04 DIAGNOSIS — I10 Essential (primary) hypertension: Secondary | ICD-10-CM | POA: Diagnosis not present

## 2017-09-04 NOTE — Telephone Encounter (Signed)
[facility] to inquire if the patient is still a guest there and if so, to confirm [facility]. As per [place], the patient is no longer staying there.

## 2017-09-05 DIAGNOSIS — R1031 Right lower quadrant pain: Secondary | ICD-10-CM | POA: Diagnosis not present

## 2017-09-07 ENCOUNTER — Ambulatory Visit: Payer: Medicare Other | Admitting: Family Medicine

## 2017-09-07 ENCOUNTER — Telehealth: Payer: Self-pay | Admitting: Family Medicine

## 2017-09-07 DIAGNOSIS — Z87891 Personal history of nicotine dependence: Secondary | ICD-10-CM | POA: Diagnosis not present

## 2017-09-07 DIAGNOSIS — Z9981 Dependence on supplemental oxygen: Secondary | ICD-10-CM | POA: Diagnosis not present

## 2017-09-07 DIAGNOSIS — M791 Myalgia, unspecified site: Secondary | ICD-10-CM | POA: Diagnosis not present

## 2017-09-07 DIAGNOSIS — Z888 Allergy status to other drugs, medicaments and biological substances status: Secondary | ICD-10-CM | POA: Diagnosis not present

## 2017-09-07 DIAGNOSIS — I10 Essential (primary) hypertension: Secondary | ICD-10-CM | POA: Diagnosis not present

## 2017-09-07 DIAGNOSIS — Z79899 Other long term (current) drug therapy: Secondary | ICD-10-CM | POA: Diagnosis not present

## 2017-09-07 DIAGNOSIS — R2243 Localized swelling, mass and lump, lower limb, bilateral: Secondary | ICD-10-CM | POA: Diagnosis not present

## 2017-09-07 DIAGNOSIS — R600 Localized edema: Secondary | ICD-10-CM | POA: Diagnosis not present

## 2017-09-07 DIAGNOSIS — Z7982 Long term (current) use of aspirin: Secondary | ICD-10-CM | POA: Diagnosis not present

## 2017-09-07 NOTE — Telephone Encounter (Signed)
Pt called to inform that she has an appt on 09/07/17 at 10:30am and she will be late [place], an she has problem with the transportation, she was advice if she can be here on time she need to reschedule, she ask me to inform the provider and according to the provider she need to reschedule appt,

## 2017-09-08 DIAGNOSIS — T148XXA Other injury of unspecified body region, initial encounter: Secondary | ICD-10-CM | POA: Diagnosis not present

## 2017-09-08 DIAGNOSIS — M79604 Pain in right leg: Secondary | ICD-10-CM | POA: Diagnosis not present

## 2017-09-09 ENCOUNTER — Emergency Department (HOSPITAL_COMMUNITY): Payer: Medicare Other

## 2017-09-09 ENCOUNTER — Encounter (HOSPITAL_COMMUNITY): Payer: Self-pay

## 2017-09-09 ENCOUNTER — Other Ambulatory Visit: Payer: Self-pay

## 2017-09-09 ENCOUNTER — Emergency Department (HOSPITAL_COMMUNITY)
Admission: EM | Admit: 2017-09-09 | Discharge: 2017-09-09 | Disposition: A | Discharge status: Home / Self Care | Payer: Medicare Other | Attending: Emergency Medicine | Admitting: Emergency Medicine

## 2017-09-09 DIAGNOSIS — S40011A Contusion of right shoulder, initial encounter: Secondary | ICD-10-CM | POA: Diagnosis not present

## 2017-09-09 DIAGNOSIS — S8001XA Contusion of right knee, initial encounter: Secondary | ICD-10-CM | POA: Insufficient documentation

## 2017-09-09 DIAGNOSIS — Z87891 Personal history of nicotine dependence: Secondary | ICD-10-CM | POA: Diagnosis not present

## 2017-09-09 DIAGNOSIS — W19XXXA Unspecified fall, initial encounter: Secondary | ICD-10-CM

## 2017-09-09 DIAGNOSIS — W050XXA Fall from non-moving wheelchair, initial encounter: Secondary | ICD-10-CM | POA: Diagnosis not present

## 2017-09-09 DIAGNOSIS — M25511 Pain in right shoulder: Secondary | ICD-10-CM | POA: Diagnosis not present

## 2017-09-09 DIAGNOSIS — M791 Myalgia, unspecified site: Secondary | ICD-10-CM | POA: Diagnosis not present

## 2017-09-09 DIAGNOSIS — Y93I9 Activity, other involving external motion: Secondary | ICD-10-CM | POA: Diagnosis not present

## 2017-09-09 DIAGNOSIS — Y999 Unspecified external cause status: Secondary | ICD-10-CM | POA: Insufficient documentation

## 2017-09-09 DIAGNOSIS — S0003XA Contusion of scalp, initial encounter: Secondary | ICD-10-CM

## 2017-09-09 DIAGNOSIS — Y939 Activity, unspecified: Secondary | ICD-10-CM | POA: Insufficient documentation

## 2017-09-09 DIAGNOSIS — Y92811 Bus as the place of occurrence of the external cause: Secondary | ICD-10-CM | POA: Diagnosis not present

## 2017-09-09 DIAGNOSIS — S8001XD Contusion of right knee, subsequent encounter: Secondary | ICD-10-CM | POA: Diagnosis not present

## 2017-09-09 DIAGNOSIS — S0093XA Contusion of unspecified part of head, initial encounter: Secondary | ICD-10-CM | POA: Diagnosis not present

## 2017-09-09 DIAGNOSIS — S0990XA Unspecified injury of head, initial encounter: Secondary | ICD-10-CM | POA: Diagnosis not present

## 2017-09-09 DIAGNOSIS — S0083XD Contusion of other part of head, subsequent encounter: Secondary | ICD-10-CM | POA: Diagnosis not present

## 2017-09-09 DIAGNOSIS — Y9289 Other specified places as the place of occurrence of the external cause: Secondary | ICD-10-CM | POA: Diagnosis not present

## 2017-09-09 DIAGNOSIS — S40911A Unspecified superficial injury of right shoulder, initial encounter: Secondary | ICD-10-CM | POA: Diagnosis present

## 2017-09-09 DIAGNOSIS — S4991XA Unspecified injury of right shoulder and upper arm, initial encounter: Secondary | ICD-10-CM | POA: Diagnosis not present

## 2017-09-09 DIAGNOSIS — S40011D Contusion of right shoulder, subsequent encounter: Secondary | ICD-10-CM | POA: Diagnosis not present

## 2017-09-09 DIAGNOSIS — M7918 Myalgia, other site: Secondary | ICD-10-CM | POA: Diagnosis not present

## 2017-09-09 DIAGNOSIS — I10 Essential (primary) hypertension: Secondary | ICD-10-CM | POA: Diagnosis not present

## 2017-09-09 DIAGNOSIS — S8991XA Unspecified injury of right lower leg, initial encounter: Secondary | ICD-10-CM | POA: Diagnosis not present
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
## 2017-09-09 NOTE — ED Notes (Signed)
Attempted to have pt sit up, pivot, and bear her own weight unsuccessfully. Pt incapable of bearing own weight or moving without assist x 2
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
## 2017-09-09 NOTE — ED Notes (Signed)
This is a complex situation, in that pt. Is homeless and without any close relative(s)/friend(s). She is minimally mobile and non-ambulatory (subjective). I check with our security here regarding them possibly bringing her power scooter, which we have ascertained is at the [facility] depot at [address] Our security informs me that our hospital policy precludes them from dealing with privately-owned equipment of any kind. We are about to obtain P.T. Eval. Pt. Remains in no distress. Our social worker, Joni Reiningicole, has spoken with pt. At length x 2 with no real resolution to her situation. Pt. Refuses to entertain the idea of signing over her monthly check to be checked into a rehab or long-term care facility.
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
## 2017-09-09 NOTE — Discharge Instructions (Signed)
Ice pack to your head, shoulder, knee.  Take Tylenol for pain.  Follow-up with family doctor for recheck if pain not improving.

## 2017-09-10 ENCOUNTER — Emergency Department (HOSPITAL_COMMUNITY)
Admission: EM | Admit: 2017-09-10 | Discharge: 2017-09-10 | Disposition: A | Discharge status: Home / Self Care | Payer: Medicare Other | Attending: Emergency Medicine | Admitting: Emergency Medicine

## 2017-09-10 ENCOUNTER — Encounter (HOSPITAL_COMMUNITY): Payer: Self-pay | Admitting: Emergency Medicine

## 2017-09-10 DIAGNOSIS — S0093XD Contusion of unspecified part of head, subsequent encounter: Secondary | ICD-10-CM

## 2017-09-10 DIAGNOSIS — S8001XD Contusion of right knee, subsequent encounter: Secondary | ICD-10-CM

## 2017-09-10 DIAGNOSIS — Z5189 Encounter for other specified aftercare: Secondary | ICD-10-CM

## 2017-09-10 DIAGNOSIS — W19XXXD Unspecified fall, subsequent encounter: Secondary | ICD-10-CM

## 2017-09-10 DIAGNOSIS — S40011D Contusion of right shoulder, subsequent encounter: Secondary | ICD-10-CM

## 2017-09-10 DIAGNOSIS — M791 Myalgia, unspecified site: Secondary | ICD-10-CM

## 2017-09-10 MED ORDER — ACETAMINOPHEN 325 MG PO TABS
650.0000 mg | ORAL_TABLET | Freq: Once | ORAL | Status: AC
  Administered 2017-09-10: 650 mg via ORAL
  Filled 2017-09-10: qty 2

## 2017-09-10 NOTE — ED Notes (Signed)
Pt left prior to signing for discharge

## 2017-09-10 NOTE — Discharge Instructions (Signed)
You can take acetaminophen 650 mg every 6 hrs for pain as needed. You will be sore after your fall. Recheck if worse.
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
## 2017-09-11 ENCOUNTER — Emergency Department (HOSPITAL_COMMUNITY)
Admission: EM | Admit: 2017-09-11 | Discharge: 2017-09-12 | Disposition: A | Discharge status: Home / Self Care | Payer: Medicare Other | Attending: Emergency Medicine | Admitting: Emergency Medicine

## 2017-09-11 ENCOUNTER — Other Ambulatory Visit: Payer: Self-pay

## 2017-09-11 ENCOUNTER — Encounter (HOSPITAL_COMMUNITY): Payer: Self-pay

## 2017-09-11 DIAGNOSIS — I10 Essential (primary) hypertension: Secondary | ICD-10-CM | POA: Diagnosis not present

## 2017-09-11 DIAGNOSIS — M25572 Pain in left ankle and joints of left foot: Secondary | ICD-10-CM | POA: Diagnosis not present

## 2017-09-11 DIAGNOSIS — Z87891 Personal history of nicotine dependence: Secondary | ICD-10-CM | POA: Diagnosis not present

## 2017-09-11 DIAGNOSIS — Z590 Homelessness: Secondary | ICD-10-CM | POA: Diagnosis not present

## 2017-09-11 DIAGNOSIS — M25552 Pain in left hip: Secondary | ICD-10-CM | POA: Insufficient documentation

## 2017-09-11 DIAGNOSIS — W19XXXA Unspecified fall, initial encounter: Secondary | ICD-10-CM | POA: Diagnosis not present

## 2017-09-11 DIAGNOSIS — S99912A Unspecified injury of left ankle, initial encounter: Secondary | ICD-10-CM | POA: Diagnosis not present

## 2017-09-11 DIAGNOSIS — S79912A Unspecified injury of left hip, initial encounter: Secondary | ICD-10-CM | POA: Diagnosis not present

## 2017-09-11 MED ORDER — HYDROCHLOROTHIAZIDE 25 MG PO TABS
12.5000 mg | ORAL_TABLET | Freq: Every day | ORAL | Status: DC
  Administered 2017-09-12: 12.5 mg via ORAL
  Filled 2017-09-11: qty 1

## 2017-09-11 MED ORDER — AMLODIPINE BESYLATE 5 MG PO TABS
10.0000 mg | ORAL_TABLET | Freq: Every day | ORAL | Status: DC
Start: 2017-09-12 — End: 2017-09-12
  Administered 2017-09-12: 10 mg via ORAL
  Filled 2017-09-11: qty 2

## 2017-09-11 MED ORDER — ALBUTEROL SULFATE HFA 108 (90 BASE) MCG/ACT IN AERS: 2.0000 | INHALATION_SPRAY | RESPIRATORY_TRACT | Status: DC | PRN

## 2017-09-11 MED ORDER — FUROSEMIDE 20 MG PO TABS
20.0000 mg | ORAL_TABLET | Freq: Every day | ORAL | Status: DC
  Administered 2017-09-12: 20 mg via ORAL
  Filled 2017-09-11: qty 1
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
## 2017-09-12 ENCOUNTER — Encounter (HOSPITAL_COMMUNITY): Payer: Self-pay | Admitting: Nurse Practitioner

## 2017-09-12 ENCOUNTER — Emergency Department (HOSPITAL_COMMUNITY): Payer: Medicare Other

## 2017-09-12 ENCOUNTER — Emergency Department (HOSPITAL_COMMUNITY)
Admission: EM | Admit: 2017-09-12 | Discharge: 2017-09-13 | Disposition: A | Discharge status: Home / Self Care | Payer: Medicare Other | Attending: Emergency Medicine | Admitting: Emergency Medicine

## 2017-09-12 DIAGNOSIS — Z5900 Homelessness unspecified: Secondary | ICD-10-CM

## 2017-09-12 DIAGNOSIS — M25572 Pain in left ankle and joints of left foot: Secondary | ICD-10-CM | POA: Diagnosis not present

## 2017-09-12 DIAGNOSIS — I10 Essential (primary) hypertension: Secondary | ICD-10-CM | POA: Diagnosis not present

## 2017-09-12 DIAGNOSIS — M25552 Pain in left hip: Secondary | ICD-10-CM | POA: Diagnosis not present

## 2017-09-12 DIAGNOSIS — S79912A Unspecified injury of left hip, initial encounter: Secondary | ICD-10-CM | POA: Diagnosis not present

## 2017-09-12 DIAGNOSIS — Z87891 Personal history of nicotine dependence: Secondary | ICD-10-CM | POA: Diagnosis not present

## 2017-09-12 DIAGNOSIS — S99912A Unspecified injury of left ankle, initial encounter: Secondary | ICD-10-CM | POA: Diagnosis not present

## 2017-09-12 NOTE — ED Notes (Signed)
Pt's left leg dressed with bacitracin, nonadherent dressing, and tape
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
## 2017-09-12 NOTE — ED Notes (Signed)
SW attempting to call shelter to ensure has room and "white flag" is in effect. [facility] advised he will go by [facility] and check to see if open.

## 2017-09-12 NOTE — ED Notes (Signed)
[facility] advised unable to transport pt w/electric w/c today. States will be able to transport pt on Monday (in 2 days). Called [facility] back and asked if can transport pt w/ w/c and leave her electric w/c in ED - advised yes. Okey Regalarol, SW, spoke [place], [place], who advised is able to obtain w/c for pt and RN spoke w/Dr Loews CorporationMesner - requested for him to enter note as to why w/c is needed.

## 2017-09-12 NOTE — ED Notes (Signed)
Pt continues to refuse CT of [place].

## 2017-09-12 NOTE — ED Notes (Addendum)
Pt is dressed into her personal clothing. Pt given d/c paperwork w/homeless shelter info and copy of belongings inventory sheet showing that her electric w/c and blanket will be kept in Pod C until she can get someone to repair it - Max of 30 days - verbalized understanding and signed sheet verifying understanding. [facility] transporting pt to [facility] - pt aware they may or may not take her in. Pt refuses to go anywhere else and states she has no one to come transport her. Pt has all of her other belongings which have stayed w/her in room.

## 2017-09-12 NOTE — ED Notes (Signed)
PureWick draining urine w/o difficulty.

## 2017-09-12 NOTE — ED Notes (Signed)
Beth Hall, CM, aware of pt's situation. [facility] who advised she will [facility] to [facility] and will return call.

## 2017-09-12 NOTE — ED Notes (Signed)
curlex added to left leg dressing per pt's request

## 2017-09-12 NOTE — ED Notes (Signed)
Pt refuses to have ct done.

## 2017-09-12 NOTE — ED Notes (Signed)
W/C plugged into wall so may charge.
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
## 2017-09-12 NOTE — ED Provider Notes (Signed)
  Patient signed out to me by evening team awaiting CT scan of the hip for possible acetabular fracture as recommended by orthopedics.  CT tech came to take her for imaging, however patient refused.  She has been resting comfortably.  I have gone back over to talk to her about this-- she understands the purpose of the imaging but states "I am feeling better now that I am in a bed".  On call orthopedics called back questioning about the scan, they were made aware of the situation.  Order has been left in the system in case patient changes her mind.  4:47 AM Patient has been resting comfortably throughout the night.  She is still declining CT.  She understands the risks of undetected fracture.  Will await social work evaluation in the morning.   Garlon HatchetSanders, Philopater Mucha M, PA-C 09/12/17 0448    Gilda CreasePollina, Christopher J, MD 09/12/17 [phone]
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
## 2017-09-12 NOTE — ED Notes (Signed)
Spoke w/[place], [facility] - advised pt is unable to come there d/t pt refuses to care for herself there - refuses to wear adult diapers she was given, urinates on chairs and floors, refuses to wear pants or skirts. States pt then thinks it's funny. Rozelle advised she will call her director and requested for RN to call her back in 15 min - [phone].

## 2017-09-12 NOTE — Progress Notes (Signed)
CSW continued to assist with discharge plan patient. Kriste BasqueBecky, RN spoke with administration at [facility] to determine if patient was eligible for a stay at the shelter there. Admin staff informed RN that patient consistently breaks rules and does not exhibit appropriate behaviors.  Kriste BasqueBecky, RN to return call to [employer] staff at approximately 5pm, after [facility] staff speaks with their director to determine eligibility for placement.   Edwin Dadaarol Thekla Colborn, MSW, LCSW-A Weekend Clinical Social Worker [phone]

## 2017-09-12 NOTE — ED Notes (Signed)
Pt sitting in w/c in hallway.

## 2017-09-12 NOTE — ED Notes (Signed)
Pt is dressing herself.

## 2017-09-12 NOTE — ED Notes (Signed)
Patient has ate her breakfast resting with call bell in reach

## 2017-09-12 NOTE — ED Notes (Addendum)
Pt's w/c was delivered to pt. Pt is aware her electric w/c and blanket will be stored in Pod C storage until she is able to arrange for someone to pick it up d/t will not charge - max of 30 days.
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
## 2017-09-12 NOTE — ED Notes (Signed)
States she does not take BP/heart meds but will take them this time.

## 2017-09-12 NOTE — ED Notes (Signed)
Beth Hall has arrived to transport pt. 

## 2017-09-12 NOTE — ED Notes (Signed)
Pt sitting in w/c in room. Advised pt of delay - waiting to hear back from [facility].

## 2017-09-12 NOTE — ED Notes (Signed)
Bedside toilet placed at bedside. Pt noted to be incont of urine.
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
## 2017-09-12 NOTE — ED Notes (Signed)
Pt's w/c is not charging. Pt resting quietly on bed w/eyes closed. Respirations even, unlabored.

## 2017-09-12 NOTE — ED Notes (Signed)
SW in w/pt. 

## 2017-09-12 NOTE — ED Notes (Signed)
Pt's stickers applied to pt's wheelchair; pt's blanket placed in belonging bag and placed on top of wheelchair with stickers; Wheelchair and blanket placed in [employer] [facility] storage outside of Pod E

## 2017-09-12 NOTE — ED Provider Notes (Signed)
On my examination patient is in no distress.  She states she wants me to call her wheelchair manufacturer and asked them to come fix her wheelchair.  I discussed with her that this was not my responsibility that we could help her get a different wheelchair while she tried to get this one working.  It does appear that her motorized wheelchair is not functional at this time and she has difficulty ambulating at baseline and is wheelchair-bound and needs a new social workers can help make this happen.  She still refusing get any further CT scans and workup but is also stable for discharge at this time.   Beth Hall, Beth Gartman, MD 09/12/17 [phone]

## 2017-09-12 NOTE — ED Notes (Signed)
RN left message for [facility] [phone]- [phone] - so may inquire if have any open beds.

## 2017-09-12 NOTE — ED Notes (Signed)
RN called [facility] [phone] - spoke w/Jonathan. Advised they are waiting on a call back from their Director. Advised pt probably will not get to return d/t pt has been there 3-5 times and refuses to cooperate w/staff. States continuing to wait to hear back from Director - RN to call back.

## 2017-09-12 NOTE — ED Notes (Signed)
Dinner tray ordered.

## 2017-09-12 NOTE — Care Management Note (Signed)
Case Management Note  Patient Details  Name: Wallace GoingDeborah L Khachatryan MRN: [id number] Date of Birth: 12/31/1940  Subjective/Objective:                    Action/Plan: Contacted [facility] to provide W/C for this pt at the request of CSW. CM will sign off for now but will be available should additional discharge needs arise or disposition change.   Expected Discharge Date:                  Expected Discharge Plan:     In-House Referral:     Discharge planning Services  CM Consult  Post Acute Care Choice:    Choice offered to:     DME Arranged:    DME Agency:     HH Arranged:    HH Agency:     Status of Service:  In process, will continue to follow  If discussed at Long Length of Stay Meetings, dates discussed:    Additional Comments:  Yvone NeuCrutchfield, Kegan Mckeithan M, RN 09/12/2017, 3:39 PM

## 2017-09-13 NOTE — ED Notes (Signed)
Pt wheeled to lobby at discharge. Verbalized understanding of discharge instructions.

## 2017-09-14 ENCOUNTER — Telehealth: Payer: Self-pay

## 2017-09-14 NOTE — Telephone Encounter (Signed)
Call received from Lebron ConnersLanae Anderson, DSS. Inquired about the status of the request for placement for the patient.  She stated that she gave the patient a copy of the FL2; but at this time, the patient does not want to do anything about placement, long term or short term.  She was not sure exactly where the patient is now staying.

## 2017-09-15 DIAGNOSIS — R0602 Shortness of breath: Secondary | ICD-10-CM | POA: Diagnosis not present

## 2017-09-15 DIAGNOSIS — M7989 Other specified soft tissue disorders: Secondary | ICD-10-CM | POA: Diagnosis not present

## 2017-09-15 DIAGNOSIS — R0609 Other forms of dyspnea: Secondary | ICD-10-CM | POA: Diagnosis not present

## 2017-09-15 DIAGNOSIS — R42 Dizziness and giddiness: Secondary | ICD-10-CM | POA: Diagnosis not present

## 2017-09-15 DIAGNOSIS — R55 Syncope and collapse: Secondary | ICD-10-CM | POA: Diagnosis not present

## 2017-09-15 DIAGNOSIS — R9431 Abnormal electrocardiogram [ECG] [EKG]: Secondary | ICD-10-CM | POA: Diagnosis not present

## 2017-09-15 DIAGNOSIS — R5383 Other fatigue: Secondary | ICD-10-CM | POA: Diagnosis not present

## 2017-09-15 DIAGNOSIS — M6281 Muscle weakness (generalized): Secondary | ICD-10-CM | POA: Diagnosis not present

## 2017-09-15 DIAGNOSIS — R05 Cough: Secondary | ICD-10-CM | POA: Diagnosis not present

## 2017-09-16 DIAGNOSIS — Z7409 Other reduced mobility: Secondary | ICD-10-CM | POA: Diagnosis not present

## 2017-09-16 DIAGNOSIS — Z86711 Personal history of pulmonary embolism: Secondary | ICD-10-CM | POA: Diagnosis not present

## 2017-09-16 DIAGNOSIS — Z23 Encounter for immunization: Secondary | ICD-10-CM | POA: Diagnosis not present

## 2017-09-16 DIAGNOSIS — R42 Dizziness and giddiness: Secondary | ICD-10-CM | POA: Diagnosis not present

## 2017-09-16 DIAGNOSIS — R911 Solitary pulmonary nodule: Secondary | ICD-10-CM | POA: Diagnosis not present

## 2017-09-16 DIAGNOSIS — Z590 Homelessness: Secondary | ICD-10-CM | POA: Diagnosis not present

## 2017-09-16 DIAGNOSIS — F4310 Post-traumatic stress disorder, unspecified: Secondary | ICD-10-CM | POA: Diagnosis not present

## 2017-09-16 DIAGNOSIS — E538 Deficiency of other specified B group vitamins: Secondary | ICD-10-CM | POA: Diagnosis not present

## 2017-09-16 DIAGNOSIS — Z7901 Long term (current) use of anticoagulants: Secondary | ICD-10-CM | POA: Diagnosis not present

## 2017-09-16 DIAGNOSIS — D509 Iron deficiency anemia, unspecified: Secondary | ICD-10-CM | POA: Diagnosis not present

## 2017-09-16 DIAGNOSIS — Z87891 Personal history of nicotine dependence: Secondary | ICD-10-CM | POA: Diagnosis not present

## 2017-09-16 DIAGNOSIS — D649 Anemia, unspecified: Secondary | ICD-10-CM | POA: Diagnosis not present

## 2017-09-16 DIAGNOSIS — I272 Pulmonary hypertension, unspecified: Secondary | ICD-10-CM | POA: Diagnosis not present

## 2017-09-16 DIAGNOSIS — I350 Nonrheumatic aortic (valve) stenosis: Secondary | ICD-10-CM | POA: Diagnosis not present

## 2017-09-16 DIAGNOSIS — R5383 Other fatigue: Secondary | ICD-10-CM | POA: Diagnosis not present

## 2017-09-16 DIAGNOSIS — J449 Chronic obstructive pulmonary disease, unspecified: Secondary | ICD-10-CM | POA: Diagnosis not present

## 2017-09-16 DIAGNOSIS — R0609 Other forms of dyspnea: Secondary | ICD-10-CM | POA: Diagnosis not present

## 2017-09-16 DIAGNOSIS — I10 Essential (primary) hypertension: Secondary | ICD-10-CM | POA: Diagnosis not present

## 2017-09-16 DIAGNOSIS — I2510 Atherosclerotic heart disease of native coronary artery without angina pectoris: Secondary | ICD-10-CM | POA: Diagnosis not present

## 2017-09-16 DIAGNOSIS — F329 Major depressive disorder, single episode, unspecified: Secondary | ICD-10-CM | POA: Diagnosis not present

## 2017-09-16 DIAGNOSIS — R7989 Other specified abnormal findings of blood chemistry: Secondary | ICD-10-CM | POA: Diagnosis not present

## 2017-09-17 ENCOUNTER — Telehealth: Payer: Self-pay | Admitting: Family Medicine

## 2017-09-17 DIAGNOSIS — R0609 Other forms of dyspnea: Secondary | ICD-10-CM | POA: Diagnosis not present

## 2017-09-17 DIAGNOSIS — I10 Essential (primary) hypertension: Secondary | ICD-10-CM | POA: Diagnosis not present

## 2017-09-17 DIAGNOSIS — R7989 Other specified abnormal findings of blood chemistry: Secondary | ICD-10-CM | POA: Diagnosis not present

## 2017-09-17 DIAGNOSIS — Z590 Homelessness: Secondary | ICD-10-CM | POA: Diagnosis not present

## 2017-09-17 DIAGNOSIS — R5383 Other fatigue: Secondary | ICD-10-CM | POA: Diagnosis not present

## 2017-09-17 DIAGNOSIS — R911 Solitary pulmonary nodule: Secondary | ICD-10-CM | POA: Diagnosis not present

## 2017-09-17 DIAGNOSIS — J449 Chronic obstructive pulmonary disease, unspecified: Secondary | ICD-10-CM | POA: Diagnosis not present

## 2017-09-17 DIAGNOSIS — Z7409 Other reduced mobility: Secondary | ICD-10-CM | POA: Diagnosis not present

## 2017-09-17 DIAGNOSIS — R42 Dizziness and giddiness: Secondary | ICD-10-CM | POA: Diagnosis not present

## 2017-09-17 DIAGNOSIS — Z86711 Personal history of pulmonary embolism: Secondary | ICD-10-CM | POA: Diagnosis not present

## 2017-09-17 DIAGNOSIS — Z7901 Long term (current) use of anticoagulants: Secondary | ICD-10-CM | POA: Diagnosis not present

## 2017-09-17 DIAGNOSIS — R9431 Abnormal electrocardiogram [ECG] [EKG]: Secondary | ICD-10-CM | POA: Diagnosis not present

## 2017-09-17 DIAGNOSIS — D649 Anemia, unspecified: Secondary | ICD-10-CM | POA: Diagnosis not present

## 2017-09-17 DIAGNOSIS — I2510 Atherosclerotic heart disease of native coronary artery without angina pectoris: Secondary | ICD-10-CM | POA: Diagnosis not present

## 2017-09-17 DIAGNOSIS — E538 Deficiency of other specified B group vitamins: Secondary | ICD-10-CM | POA: Diagnosis not present

## 2017-09-17 NOTE — Telephone Encounter (Signed)
Patient called to inform you that she was [facility] [place]

## 2017-09-18 DIAGNOSIS — Z86711 Personal history of pulmonary embolism: Secondary | ICD-10-CM | POA: Diagnosis not present

## 2017-09-18 DIAGNOSIS — R911 Solitary pulmonary nodule: Secondary | ICD-10-CM | POA: Diagnosis not present

## 2017-09-18 DIAGNOSIS — J449 Chronic obstructive pulmonary disease, unspecified: Secondary | ICD-10-CM | POA: Diagnosis not present

## 2017-09-18 DIAGNOSIS — Z7409 Other reduced mobility: Secondary | ICD-10-CM | POA: Diagnosis not present

## 2017-09-18 DIAGNOSIS — R0600 Dyspnea, unspecified: Secondary | ICD-10-CM | POA: Diagnosis not present

## 2017-09-18 DIAGNOSIS — I10 Essential (primary) hypertension: Secondary | ICD-10-CM | POA: Diagnosis not present

## 2017-09-18 DIAGNOSIS — R42 Dizziness and giddiness: Secondary | ICD-10-CM | POA: Diagnosis not present

## 2017-09-18 DIAGNOSIS — Z7901 Long term (current) use of anticoagulants: Secondary | ICD-10-CM | POA: Diagnosis not present

## 2017-09-18 DIAGNOSIS — R7989 Other specified abnormal findings of blood chemistry: Secondary | ICD-10-CM | POA: Diagnosis not present

## 2017-09-18 DIAGNOSIS — R5383 Other fatigue: Secondary | ICD-10-CM | POA: Diagnosis not present

## 2017-09-18 DIAGNOSIS — I2510 Atherosclerotic heart disease of native coronary artery without angina pectoris: Secondary | ICD-10-CM | POA: Diagnosis not present

## 2017-09-18 DIAGNOSIS — D509 Iron deficiency anemia, unspecified: Secondary | ICD-10-CM | POA: Diagnosis not present

## 2017-09-18 DIAGNOSIS — Z590 Homelessness: Secondary | ICD-10-CM | POA: Diagnosis not present

## 2017-09-18 DIAGNOSIS — E538 Deficiency of other specified B group vitamins: Secondary | ICD-10-CM | POA: Diagnosis not present

## 2017-09-19 DIAGNOSIS — I10 Essential (primary) hypertension: Secondary | ICD-10-CM | POA: Diagnosis not present

## 2017-09-19 DIAGNOSIS — R5383 Other fatigue: Secondary | ICD-10-CM | POA: Diagnosis not present

## 2017-09-19 DIAGNOSIS — R911 Solitary pulmonary nodule: Secondary | ICD-10-CM | POA: Diagnosis not present

## 2017-09-19 DIAGNOSIS — I2510 Atherosclerotic heart disease of native coronary artery without angina pectoris: Secondary | ICD-10-CM | POA: Diagnosis not present

## 2017-09-19 DIAGNOSIS — Z7409 Other reduced mobility: Secondary | ICD-10-CM | POA: Diagnosis not present

## 2017-09-19 DIAGNOSIS — R0600 Dyspnea, unspecified: Secondary | ICD-10-CM | POA: Diagnosis not present

## 2017-09-19 DIAGNOSIS — E538 Deficiency of other specified B group vitamins: Secondary | ICD-10-CM | POA: Diagnosis not present

## 2017-09-19 DIAGNOSIS — Z7901 Long term (current) use of anticoagulants: Secondary | ICD-10-CM | POA: Diagnosis not present

## 2017-09-19 DIAGNOSIS — R42 Dizziness and giddiness: Secondary | ICD-10-CM | POA: Diagnosis not present

## 2017-09-19 DIAGNOSIS — D509 Iron deficiency anemia, unspecified: Secondary | ICD-10-CM | POA: Diagnosis not present

## 2017-09-19 DIAGNOSIS — R7989 Other specified abnormal findings of blood chemistry: Secondary | ICD-10-CM | POA: Diagnosis not present

## 2017-09-19 DIAGNOSIS — Z86711 Personal history of pulmonary embolism: Secondary | ICD-10-CM | POA: Diagnosis not present

## 2017-09-19 DIAGNOSIS — Z590 Homelessness: Secondary | ICD-10-CM | POA: Diagnosis not present

## 2017-09-19 DIAGNOSIS — Z87891 Personal history of nicotine dependence: Secondary | ICD-10-CM | POA: Diagnosis not present

## 2017-09-19 DIAGNOSIS — J449 Chronic obstructive pulmonary disease, unspecified: Secondary | ICD-10-CM | POA: Diagnosis not present

## 2017-09-22 ENCOUNTER — Ambulatory Visit: Payer: Medicare Other | Attending: Family Medicine | Admitting: Family Medicine

## 2017-09-22 ENCOUNTER — Other Ambulatory Visit: Payer: Self-pay | Admitting: Family Medicine

## 2017-09-22 ENCOUNTER — Encounter: Payer: Self-pay | Admitting: Family Medicine

## 2017-09-22 VITALS — BP 125/70 | HR 74 | Temp 98.6°F | Resp 18 | Ht 61.0 in | Wt 279.2 lb

## 2017-09-22 DIAGNOSIS — F99 Mental disorder, not otherwise specified: Secondary | ICD-10-CM | POA: Insufficient documentation

## 2017-09-22 DIAGNOSIS — Z6841 Body Mass Index (BMI) 40.0 and over, adult: Secondary | ICD-10-CM | POA: Diagnosis not present

## 2017-09-22 DIAGNOSIS — Z760 Encounter for issue of repeat prescription: Secondary | ICD-10-CM | POA: Diagnosis not present

## 2017-09-22 DIAGNOSIS — M549 Dorsalgia, unspecified: Secondary | ICD-10-CM | POA: Insufficient documentation

## 2017-09-22 DIAGNOSIS — Z87898 Personal history of other specified conditions: Secondary | ICD-10-CM

## 2017-09-22 DIAGNOSIS — Z7901 Long term (current) use of anticoagulants: Secondary | ICD-10-CM | POA: Insufficient documentation

## 2017-09-22 DIAGNOSIS — M7918 Myalgia, other site: Secondary | ICD-10-CM | POA: Diagnosis not present

## 2017-09-22 DIAGNOSIS — K769 Liver disease, unspecified: Secondary | ICD-10-CM | POA: Diagnosis not present

## 2017-09-22 DIAGNOSIS — K7689 Other specified diseases of liver: Secondary | ICD-10-CM | POA: Insufficient documentation

## 2017-09-22 DIAGNOSIS — R911 Solitary pulmonary nodule: Secondary | ICD-10-CM | POA: Diagnosis not present

## 2017-09-22 DIAGNOSIS — Z79899 Other long term (current) drug therapy: Secondary | ICD-10-CM | POA: Diagnosis not present

## 2017-09-22 DIAGNOSIS — Z590 Homelessness: Secondary | ICD-10-CM | POA: Diagnosis not present

## 2017-09-22 DIAGNOSIS — I10 Essential (primary) hypertension: Secondary | ICD-10-CM | POA: Insufficient documentation

## 2017-09-22 DIAGNOSIS — E669 Obesity, unspecified: Secondary | ICD-10-CM | POA: Diagnosis not present

## 2017-09-22 DIAGNOSIS — Z9119 Patient's noncompliance with other medical treatment and regimen: Secondary | ICD-10-CM | POA: Insufficient documentation

## 2017-09-22 DIAGNOSIS — Z9111 Patient's noncompliance with dietary regimen: Secondary | ICD-10-CM | POA: Diagnosis not present

## 2017-09-22 DIAGNOSIS — F4310 Post-traumatic stress disorder, unspecified: Secondary | ICD-10-CM | POA: Diagnosis not present

## 2017-09-22 DIAGNOSIS — Z993 Dependence on wheelchair: Secondary | ICD-10-CM | POA: Diagnosis not present

## 2017-09-22 DIAGNOSIS — Z86711 Personal history of pulmonary embolism: Secondary | ICD-10-CM | POA: Insufficient documentation

## 2017-09-22 MED ORDER — CYCLOBENZAPRINE HCL 5 MG PO TABS: 10.0000 mg | ORAL_TABLET | Freq: Two times a day (BID) | ORAL | 0 refills | Status: DC | PRN

## 2017-09-22 MED ORDER — ACETAMINOPHEN 500 MG PO TABS: 1000.0000 mg | ORAL_TABLET | Freq: Four times a day (QID) | ORAL | 2 refills | Status: DC | PRN

## 2017-09-22 MED FILL — CYCLOBENZAPRINE 5 MG TABLET: 5 | 7 days supply | Qty: 30 | Fill #0

## 2017-09-22 NOTE — Progress Notes (Signed)
Patient is here for f/up  

## 2017-09-22 NOTE — Patient Instructions (Signed)
DASH Eating Plan DASH stands for "Dietary Approaches to Stop Hypertension." The DASH eating plan is a healthy eating plan that has been shown to reduce high blood pressure (hypertension). It may also reduce your risk for type 2 diabetes, heart disease, and stroke. The DASH eating plan may also help with weight loss. What are tips for following this plan? General guidelines  Avoid eating more than 2,300 mg (milligrams) of salt (sodium) a day. If you have hypertension, you may need to reduce your sodium intake to 1,500 mg a day.  Limit alcohol intake to no more than 1 drink a day for nonpregnant women and 2 drinks a day for men. One drink equals 12 oz of beer, 5 oz of wine, or 1 oz of hard liquor.  Work with your health care provider to maintain a healthy body weight or to lose weight. Ask what an ideal weight is for you.  Get at least 30 minutes of exercise that causes your heart to beat faster (aerobic exercise) most days of the week. Activities may include walking, swimming, or biking.  Work with your health care provider or diet and nutrition specialist (dietitian) to adjust your eating plan to your individual calorie needs. Reading food labels  Check food labels for the amount of sodium per serving. Choose foods with less than 5 percent of the Daily Value of sodium. Generally, foods with less than 300 mg of sodium per serving fit into this eating plan.  To find whole grains, look for the word "whole" as the first word in the ingredient list. Shopping  Buy products labeled as "low-sodium" or "no salt added."  Buy fresh foods. Avoid canned foods and premade or frozen meals. Cooking  Avoid adding salt when cooking. Use salt-free seasonings or herbs instead of table salt or sea salt. Check with your health care provider or pharmacist before using salt substitutes.  Do not fry foods. Cook foods using healthy methods such as baking, boiling, grilling, and broiling instead.  Cook with  heart-healthy oils, such as olive, canola, soybean, or sunflower oil. Meal planning   Eat a balanced diet that includes: ? 5 or more servings of fruits and vegetables each day. At each meal, try to fill half of your plate with fruits and vegetables. ? Up to 6-8 servings of whole grains each day. ? Less than 6 oz of lean meat, poultry, or fish each day. A 3-oz serving of meat is about the same size as a deck of cards. One egg equals 1 oz. ? 2 servings of low-fat dairy each day. ? A serving of nuts, seeds, or beans 5 times each week. ? Heart-healthy fats. Healthy fats called Omega-3 fatty acids are found in foods such as flaxseeds and coldwater fish, like sardines, salmon, and mackerel.  Limit how much you eat of the following: ? Canned or prepackaged foods. ? Food that is high in trans fat, such as fried foods. ? Food that is high in saturated fat, such as fatty meat. ? Sweets, desserts, sugary drinks, and other foods with added sugar. ? Full-fat dairy products.  Do not salt foods before eating.  Try to eat at least 2 vegetarian meals each week.  Eat more home-cooked food and less restaurant, buffet, and fast food.  When eating at a restaurant, ask that your food be prepared with less salt or no salt, if possible. What foods are recommended? The items listed may not be a complete list. Talk with your dietitian about what   dietary choices are best for you. Grains Whole-grain or whole-wheat bread. Whole-grain or whole-wheat pasta. Brown rice. Oatmeal. Quinoa. Bulgur. Whole-grain and low-sodium cereals. Pita bread. Low-fat, low-sodium crackers. Whole-wheat flour tortillas. Vegetables Fresh or frozen vegetables (raw, steamed, roasted, or grilled). Low-sodium or reduced-sodium tomato and vegetable juice. Low-sodium or reduced-sodium tomato sauce and tomato paste. Low-sodium or reduced-sodium canned vegetables. Fruits All fresh, dried, or frozen fruit. Canned fruit in natural juice (without  added sugar). Meat and other protein foods Skinless chicken or turkey. Ground chicken or turkey. Pork with fat trimmed off. Fish and seafood. Egg whites. Dried beans, peas, or lentils. Unsalted nuts, nut butters, and seeds. Unsalted canned beans. Lean cuts of beef with fat trimmed off. Low-sodium, lean deli meat. Dairy Low-fat (1%) or fat-free (skim) milk. Fat-free, low-fat, or reduced-fat cheeses. Nonfat, low-sodium ricotta or cottage cheese. Low-fat or nonfat yogurt. Low-fat, low-sodium cheese. Fats and oils Soft margarine without trans fats. Vegetable oil. Low-fat, reduced-fat, or light mayonnaise and salad dressings (reduced-sodium). Canola, safflower, olive, soybean, and sunflower oils. Avocado. Seasoning and other foods Herbs. Spices. Seasoning mixes without salt. Unsalted popcorn and pretzels. Fat-free sweets. What foods are not recommended? The items listed may not be a complete list. Talk with your dietitian about what dietary choices are best for you. Grains Baked goods made with fat, such as croissants, muffins, or some breads. Dry pasta or rice meal packs. Vegetables Creamed or fried vegetables. Vegetables in a cheese sauce. Regular canned vegetables (not low-sodium or reduced-sodium). Regular canned tomato sauce and paste (not low-sodium or reduced-sodium). Regular tomato and vegetable juice (not low-sodium or reduced-sodium). Pickles. Olives. Fruits Canned fruit in a light or heavy syrup. Fried fruit. Fruit in cream or butter sauce. Meat and other protein foods Fatty cuts of meat. Ribs. Fried meat. Bacon. Sausage. Bologna and other processed lunch meats. Salami. Fatback. Hotdogs. Bratwurst. Salted nuts and seeds. Canned beans with added salt. Canned or smoked fish. Whole eggs or egg yolks. Chicken or turkey with skin. Dairy Whole or 2% milk, cream, and half-and-half. Whole or full-fat cream cheese. Whole-fat or sweetened yogurt. Full-fat cheese. Nondairy creamers. Whipped toppings.  Processed cheese and cheese spreads. Fats and oils Butter. Stick margarine. Lard. Shortening. Ghee. Bacon fat. Tropical oils, such as coconut, palm kernel, or palm oil. Seasoning and other foods Salted popcorn and pretzels. Onion salt, garlic salt, seasoned salt, table salt, and sea salt. Worcestershire sauce. Tartar sauce. Barbecue sauce. Teriyaki sauce. Soy sauce, including reduced-sodium. Steak sauce. Canned and packaged gravies. Fish sauce. Oyster sauce. Cocktail sauce. Horseradish that you find on the shelf. Ketchup. Mustard. Meat flavorings and tenderizers. Bouillon cubes. Hot sauce and Tabasco sauce. Premade or packaged marinades. Premade or packaged taco seasonings. Relishes. Regular salad dressings. Where to find more information:  National Heart, Lung, and Blood Institute: www.nhlbi.nih.gov  American Heart Association: www.heart.org Summary  The DASH eating plan is a healthy eating plan that has been shown to reduce high blood pressure (hypertension). It may also reduce your risk for type 2 diabetes, heart disease, and stroke.  With the DASH eating plan, you should limit salt (sodium) intake to 2,300 mg a day. If you have hypertension, you may need to reduce your sodium intake to 1,500 mg a day.  When on the DASH eating plan, aim to eat more fresh fruits and vegetables, whole grains, lean proteins, low-fat dairy, and heart-healthy fats.  Work with your health care provider or diet and nutrition specialist (dietitian) to adjust your eating plan to your individual   calorie needs. This information is not intended to replace advice given to you by your health care provider. Make sure you discuss any questions you have with your health care provider. Document Released: 09/25/2011 Document Revised: 09/29/2016 Document Reviewed: 09/29/2016 Elsevier Interactive Patient Education  2017 Elsevier Inc.  

## 2017-09-23 LAB — CBC
Hematocrit: 34.5 % (ref 34.0–46.6)
Hemoglobin: 10.9 g/dL — ABNORMAL LOW (ref 11.1–15.9)
MCH: 25.9 pg — AB (ref 26.6–33.0)
MCHC: 31.6 g/dL (ref 31.5–35.7)
MCV: 82 fL (ref 79–97)
PLATELETS: 257 10*3/uL (ref 150–379)
RBC: 4.21 x10E6/uL (ref 3.77–5.28)
RDW: 16.7 % — AB (ref 12.3–15.4)
WBC: 5.1 10*3/uL (ref 3.4–10.8)

## 2017-09-24 ENCOUNTER — Telehealth: Payer: Self-pay | Admitting: Surgery
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
## 2017-09-25 ENCOUNTER — Encounter: Payer: Self-pay | Admitting: *Deleted

## 2017-09-25 NOTE — Progress Notes (Signed)
Subjective:  Patient ID: Beth Hall, female    DOB: 04/15/1941  Age: 76 y.o. MRN: [id number]  CC: Follow-up   HPI Beth Hall 76 year old female who presents for follow-up.  Past medical history of hypertension, cellulitis, PE, homelessness, and PTSD with a history of multiple ED visits. History of HTN. Use of agents associated with hypertension: none. History of target organ damage: none. She is not exercising and is not adherent to low salt diet.  She does not check BP. She reports not taking BP medication. Cardiac symptoms none. Patient denies chest pain, chest pressure/discomfort, near-syncope and syncope.  Cardiovascular risk factors: advanced age (older than 7455 for men, 965 for women), hypertension, obesity (BMI >= 30 kg/m2) and sedentary lifestyle.  History of pulmonary embolism in June 2017.  She was prescribed Xarelto, none previously major risk factor decreased immobility.  She reports nonadherence with Xarelto use.  Chest CT noted incidental findings of solitary pulmonary nodule 6 mm in the right lower lobe and 4 cm hypodensity liver lesion in the left hepatic lobe.  It was recommended that she have follow-up CT at 12 months. History of fall last month and was evaluated and treated in the  ED on 09/15/2017.  She reports right-sided lateral back pain.  She denies any decreased ROM.  Associated symptoms include tenderness.  She denies any bruising.   Outpatient Medications Prior to Visit  Medication Sig Dispense Refill  . ferrous sulfate 325 (65 FE) MG tablet Take 325 mg by mouth 2 (two) times daily.    . rivaroxaban (XARELTO) 20 MG TABS tablet Take 20 mg by mouth daily.    . vitamin B-12 (CYANOCOBALAMIN) 1000 MCG tablet Take 1,000 mcg by mouth every morning.    Marland Kitchen. albuterol (PROVENTIL HFA;VENTOLIN HFA) 108 (90 Base) MCG/ACT inhaler Inhale 90 puffs into the lungs 2 (two) times daily.    Marland Kitchen. acetaminophen (TYLENOL) 500 MG tablet Take 1 tablet (500 mg total) by mouth every 6 (six) hours  as needed. (Patient taking differently: Take 500 mg by mouth every 6 (six) hours as needed. ) 30 tablet 0   Facility-Administered Medications Prior to Visit  Medication Dose Route Frequency Provider Last Rate Last Dose  . bacitracin ointment 1 application  1 application Topical Once Artice Holohan R, FNP        ROS Review of Systems  Constitutional: Negative.   Eyes: Negative.   Respiratory: Negative.   Cardiovascular: Negative.   Gastrointestinal: Negative.   Musculoskeletal: Positive for back pain.       Wheelchair bound   Skin: Negative.   Neurological: Negative.   Psychiatric/Behavioral: Positive for behavioral problems. Negative for suicidal ideas.    Objective:  BP 125/70 (BP Location: Left Arm, Patient Position: Sitting, Cuff Size: Normal)   Pulse 74   Temp 98.6 F (37 C) (Oral)   Resp 18   Ht 5\' 1"  (1.549 m)   Wt 279 lb 3.2 oz (126.6 kg)   SpO2 98%   BMI 52.75 kg/m   BP/Weight 09/22/2017 09/13/2017 09/12/2017  Systolic BP 125 162 -  Diastolic BP 70 64 -  Wt. (Lbs) 279.2 - 270  BMI 52.75 - 49.38    Physical Exam  Constitutional: She appears well-developed and well-nourished.  Eyes: Conjunctivae are normal. Pupils are equal, round, and reactive to light.  Neck: No JVD present.  Cardiovascular: Normal rate, regular rhythm, normal heart sounds and intact distal pulses.  Pulmonary/Chest: Effort normal and breath sounds normal.  Abdominal: Soft. Bowel  sounds are normal. There is no tenderness.  Musculoskeletal: She exhibits tenderness.       Lumbar back: She exhibits pain (right sided lateral back pain.).  Skin: Skin is warm and dry.  Bilateral lower extremities: Extremities are warm, dry, no drainage present; skin is intact with no open areas present.  Psychiatric: Her speech is rapid and/or pressured and tangential. She expresses no homicidal and no suicidal ideation. She expresses no suicidal plans and no homicidal plans.  Nursing note and vitals  reviewed.   Assessment & Plan:   1. History of solitary pulmonary nodule  - CT CHEST W WO CONTRAST; Future - albuterol (PROVENTIL HFA;VENTOLIN HFA) 108 (90 Base) MCG/ACT inhaler; Inhale 90 puffs into the lungs 2 (two) times daily.  2. Liver lesion  - CT ABDOMEN W WO CONTRAST; Future - COMPLETE METABOLIC PANEL WITH GFR - CBC  3. PTSD (post-traumatic stress disorder) History of PTSD. - Ambulatory referral to Psychiatry  4. Solitary pulmonary nodule on lung CT  - CT CHEST W WO CONTRAST; Future  5. Mental disorder, not otherwise specified Referral for evaluation of underlying mental disorder. - Ambulatory referral to Psychiatry  6. Liver disease  - COMPLETE METABOLIC PANEL WITH GFR - CBC  7. Musculoskeletal pain  - acetaminophen (TYLENOL) 500 MG tablet; Take 2 tablets (1,000 mg total) by mouth every 6 (six) hours as needed for moderate pain.  Dispense: 30 tablet; Refill: 2 - cyclobenzaprine (FLEXERIL) 5 MG tablet; Take 2 tablets (10 mg total) by mouth 2 (two) times daily as needed for muscle spasms.  Dispense: 30 tablet; Refill: 0  8. Medication refill Encouraged and discussed the importance of adherence with medications. - vitamin B-12 (CYANOCOBALAMIN) 1000 MCG tablet; Take 1,000 mcg by mouth every morning. - rivaroxaban (XARELTO) 20 MG TABS tablet; Take 20 mg by mouth daily. - ferrous sulfate 325 (65 FE) MG tablet; Take 325 mg by mouth 2 (two) times daily.     Follow-up: Return in about 3 months (around 12/21/2017), or if symptoms worsen or fail to improve, for HTN.   Lizbeth BarkMandesia R Kalaya Infantino FNP

## 2017-09-25 NOTE — Progress Notes (Signed)
Pt wheelchair in ED decontam room needing repair.  EDCM contacted Ramond DialBrian Keith (repair guy) who gave a quote of about $450 to come out, diagnose and repair AND he could not get to it until Monday or Tuesday of next week.  EDCM reached [facility] to [facility] Kizzie Furnish(Donna Fellmy) who states they can not repair equipment distributed by another company.  EDCM spoke with leadership Kennon Rounds(Sally) who will contact [facility] to see if we can have wheelchair replaced or repaired.  Will await direction from leadership.  Case matter has been discussed with pt,  ED leadership Richardean Chimera(Loury), Nurse 1st Arline Asp(Cindy), ED Charge RN ([place]had) and [facility] RN Marylene Land(Angela).

## 2017-09-28 ENCOUNTER — Ambulatory Visit (HOSPITAL_COMMUNITY): Payer: Medicare Other | Attending: Family Medicine

## 2017-10-05 ENCOUNTER — Other Ambulatory Visit: Payer: Self-pay | Admitting: Family Medicine

## 2017-10-05 DIAGNOSIS — H16429 Pannus (corneal), unspecified eye: Secondary | ICD-10-CM | POA: Diagnosis not present

## 2017-10-05 DIAGNOSIS — Z599 Problem related to housing and economic circumstances, unspecified: Secondary | ICD-10-CM | POA: Diagnosis not present

## 2017-10-05 DIAGNOSIS — R21 Rash and other nonspecific skin eruption: Secondary | ICD-10-CM | POA: Diagnosis not present

## 2017-10-05 DIAGNOSIS — E65 Localized adiposity: Secondary | ICD-10-CM | POA: Diagnosis not present

## 2017-10-05 DIAGNOSIS — Z5321 Procedure and treatment not carried out due to patient leaving prior to being seen by health care provider: Secondary | ICD-10-CM | POA: Diagnosis not present

## 2017-10-05 DIAGNOSIS — Z993 Dependence on wheelchair: Secondary | ICD-10-CM | POA: Diagnosis not present

## 2017-10-05 DIAGNOSIS — Z4689 Encounter for fitting and adjustment of other specified devices: Secondary | ICD-10-CM | POA: Diagnosis not present

## 2017-10-06 DIAGNOSIS — H16429 Pannus (corneal), unspecified eye: Secondary | ICD-10-CM | POA: Diagnosis not present

## 2017-10-06 DIAGNOSIS — R21 Rash and other nonspecific skin eruption: Secondary | ICD-10-CM | POA: Diagnosis not present

## 2017-10-07 DIAGNOSIS — Z993 Dependence on wheelchair: Secondary | ICD-10-CM | POA: Diagnosis not present

## 2017-10-07 DIAGNOSIS — G8191 Hemiplegia, unspecified affecting right dominant side: Secondary | ICD-10-CM | POA: Diagnosis not present

## 2017-10-09 DIAGNOSIS — R609 Edema, unspecified: Secondary | ICD-10-CM | POA: Diagnosis not present

## 2017-10-09 DIAGNOSIS — M79605 Pain in left leg: Secondary | ICD-10-CM | POA: Diagnosis not present

## 2017-10-09 DIAGNOSIS — R52 Pain, unspecified: Secondary | ICD-10-CM | POA: Diagnosis not present

## 2017-10-09 DIAGNOSIS — R0602 Shortness of breath: Secondary | ICD-10-CM | POA: Diagnosis not present

## 2017-10-09 DIAGNOSIS — L03116 Cellulitis of left lower limb: Secondary | ICD-10-CM | POA: Diagnosis not present

## 2017-10-13 DIAGNOSIS — Z87891 Personal history of nicotine dependence: Secondary | ICD-10-CM | POA: Diagnosis not present

## 2017-10-13 DIAGNOSIS — R6883 Chills (without fever): Secondary | ICD-10-CM | POA: Diagnosis not present

## 2017-10-13 DIAGNOSIS — R05 Cough: Secondary | ICD-10-CM | POA: Diagnosis not present

## 2017-10-13 DIAGNOSIS — Z6841 Body Mass Index (BMI) 40.0 and over, adult: Secondary | ICD-10-CM | POA: Diagnosis not present

## 2017-10-13 DIAGNOSIS — M791 Myalgia, unspecified site: Secondary | ICD-10-CM | POA: Diagnosis not present

## 2017-10-13 DIAGNOSIS — R0981 Nasal congestion: Secondary | ICD-10-CM | POA: Diagnosis not present

## 2017-10-13 DIAGNOSIS — R509 Fever, unspecified: Secondary | ICD-10-CM | POA: Diagnosis not present

## 2017-10-13 DIAGNOSIS — I10 Essential (primary) hypertension: Secondary | ICD-10-CM | POA: Diagnosis not present

## 2017-10-13 DIAGNOSIS — Z86711 Personal history of pulmonary embolism: Secondary | ICD-10-CM | POA: Diagnosis not present

## 2017-10-16 ENCOUNTER — Telehealth: Payer: Self-pay | Admitting: *Deleted

## 2017-10-16 NOTE — Telephone Encounter (Signed)
Notes recorded by Lizbeth BarkHairston, Mandesia R, FNP on 10/05/2017 at 8:45 AM EST Blood work shows mild anemia.  Continue to take your iron supplements as prescribed. Follow up in 3 months.   Unable to reach patient, no answer.

## 2017-10-20 DIAGNOSIS — R296 Repeated falls: Secondary | ICD-10-CM | POA: Diagnosis not present

## 2017-10-20 DIAGNOSIS — E876 Hypokalemia: Secondary | ICD-10-CM | POA: Diagnosis not present

## 2017-10-20 DIAGNOSIS — N8110 Cystocele, unspecified: Secondary | ICD-10-CM | POA: Diagnosis not present

## 2017-10-20 DIAGNOSIS — R2241 Localized swelling, mass and lump, right lower limb: Secondary | ICD-10-CM | POA: Diagnosis not present

## 2017-10-20 DIAGNOSIS — M79605 Pain in left leg: Secondary | ICD-10-CM | POA: Diagnosis not present

## 2017-10-20 DIAGNOSIS — I739 Peripheral vascular disease, unspecified: Secondary | ICD-10-CM | POA: Diagnosis not present

## 2017-10-20 DIAGNOSIS — Z9114 Patient's other noncompliance with medication regimen: Secondary | ICD-10-CM | POA: Diagnosis not present

## 2017-10-20 DIAGNOSIS — I8312 Varicose veins of left lower extremity with inflammation: Secondary | ICD-10-CM | POA: Diagnosis not present

## 2017-10-20 DIAGNOSIS — M79606 Pain in leg, unspecified: Secondary | ICD-10-CM | POA: Diagnosis not present

## 2017-10-20 DIAGNOSIS — M79604 Pain in right leg: Secondary | ICD-10-CM | POA: Diagnosis not present

## 2017-10-20 DIAGNOSIS — Z87891 Personal history of nicotine dependence: Secondary | ICD-10-CM | POA: Diagnosis not present

## 2017-10-20 DIAGNOSIS — L03119 Cellulitis of unspecified part of limb: Secondary | ICD-10-CM | POA: Diagnosis not present

## 2017-10-20 DIAGNOSIS — I10 Essential (primary) hypertension: Secondary | ICD-10-CM | POA: Diagnosis not present

## 2017-10-20 DIAGNOSIS — R2242 Localized swelling, mass and lump, left lower limb: Secondary | ICD-10-CM | POA: Diagnosis not present

## 2017-10-20 DIAGNOSIS — L910 Hypertrophic scar: Secondary | ICD-10-CM | POA: Diagnosis not present

## 2017-10-20 DIAGNOSIS — L03116 Cellulitis of left lower limb: Secondary | ICD-10-CM | POA: Diagnosis not present

## 2017-10-20 DIAGNOSIS — N3946 Mixed incontinence: Secondary | ICD-10-CM | POA: Diagnosis not present

## 2017-10-20 DIAGNOSIS — Z590 Homelessness: Secondary | ICD-10-CM | POA: Diagnosis not present

## 2017-10-20 DIAGNOSIS — J069 Acute upper respiratory infection, unspecified: Secondary | ICD-10-CM | POA: Diagnosis not present

## 2017-10-20 DIAGNOSIS — R531 Weakness: Secondary | ICD-10-CM | POA: Diagnosis not present

## 2017-10-20 DIAGNOSIS — R05 Cough: Secondary | ICD-10-CM | POA: Diagnosis not present

## 2017-10-20 DIAGNOSIS — R609 Edema, unspecified: Secondary | ICD-10-CM | POA: Diagnosis not present

## 2017-10-20 DIAGNOSIS — I8311 Varicose veins of right lower extremity with inflammation: Secondary | ICD-10-CM | POA: Diagnosis not present

## 2017-10-20 DIAGNOSIS — L03115 Cellulitis of right lower limb: Secondary | ICD-10-CM | POA: Diagnosis not present

## 2017-10-20 DIAGNOSIS — J449 Chronic obstructive pulmonary disease, unspecified: Secondary | ICD-10-CM | POA: Diagnosis not present

## 2017-10-20 DIAGNOSIS — R262 Difficulty in walking, not elsewhere classified: Secondary | ICD-10-CM | POA: Diagnosis not present

## 2017-10-21 DIAGNOSIS — R531 Weakness: Secondary | ICD-10-CM | POA: Diagnosis not present

## 2017-10-21 DIAGNOSIS — R262 Difficulty in walking, not elsewhere classified: Secondary | ICD-10-CM | POA: Diagnosis not present

## 2017-10-21 DIAGNOSIS — J449 Chronic obstructive pulmonary disease, unspecified: Secondary | ICD-10-CM | POA: Diagnosis not present

## 2017-10-21 DIAGNOSIS — L03119 Cellulitis of unspecified part of limb: Secondary | ICD-10-CM | POA: Diagnosis not present

## 2017-10-21 DIAGNOSIS — R296 Repeated falls: Secondary | ICD-10-CM | POA: Diagnosis not present

## 2017-10-21 DIAGNOSIS — J069 Acute upper respiratory infection, unspecified: Secondary | ICD-10-CM | POA: Diagnosis not present

## 2017-10-21 DIAGNOSIS — L03116 Cellulitis of left lower limb: Secondary | ICD-10-CM | POA: Diagnosis not present

## 2017-10-21 DIAGNOSIS — N8110 Cystocele, unspecified: Secondary | ICD-10-CM | POA: Diagnosis not present

## 2017-10-21 DIAGNOSIS — E876 Hypokalemia: Secondary | ICD-10-CM | POA: Diagnosis not present

## 2017-10-21 DIAGNOSIS — N3946 Mixed incontinence: Secondary | ICD-10-CM | POA: Diagnosis not present

## 2017-10-22 DIAGNOSIS — Z7409 Other reduced mobility: Secondary | ICD-10-CM | POA: Diagnosis not present

## 2017-10-22 DIAGNOSIS — I890 Lymphedema, not elsewhere classified: Secondary | ICD-10-CM | POA: Diagnosis not present

## 2017-10-22 DIAGNOSIS — L03116 Cellulitis of left lower limb: Secondary | ICD-10-CM | POA: Diagnosis not present

## 2017-10-29 ENCOUNTER — Telehealth: Payer: Self-pay | Admitting: *Deleted

## 2017-10-29 NOTE — Telephone Encounter (Signed)
Pt came into office stating she needs a  Letter sent to [employer], (Medicare) to cover repairs for chair. She states it should state in the letter that it is a necessity for her to have the chair for her health and ability to get around.

## 2017-10-30 ENCOUNTER — Encounter (HOSPITAL_COMMUNITY): Payer: Self-pay | Admitting: Emergency Medicine

## 2017-10-30 ENCOUNTER — Telehealth: Payer: Self-pay | Admitting: Family Medicine

## 2017-10-30 ENCOUNTER — Emergency Department (HOSPITAL_COMMUNITY): Payer: Medicare Other

## 2017-10-30 DIAGNOSIS — J069 Acute upper respiratory infection, unspecified: Secondary | ICD-10-CM | POA: Insufficient documentation

## 2017-10-30 DIAGNOSIS — Z79899 Other long term (current) drug therapy: Secondary | ICD-10-CM | POA: Insufficient documentation

## 2017-10-30 DIAGNOSIS — Z7901 Long term (current) use of anticoagulants: Secondary | ICD-10-CM | POA: Insufficient documentation

## 2017-10-30 DIAGNOSIS — I10 Essential (primary) hypertension: Secondary | ICD-10-CM | POA: Diagnosis not present

## 2017-10-30 DIAGNOSIS — R0981 Nasal congestion: Secondary | ICD-10-CM | POA: Diagnosis present

## 2017-10-30 DIAGNOSIS — Z87891 Personal history of nicotine dependence: Secondary | ICD-10-CM | POA: Diagnosis not present

## 2017-10-30 DIAGNOSIS — Z590 Homelessness: Secondary | ICD-10-CM | POA: Diagnosis not present

## 2017-10-30 DIAGNOSIS — R05 Cough: Secondary | ICD-10-CM | POA: Diagnosis not present

## 2017-10-30 LAB — CBC
HEMATOCRIT: 36.2 % (ref 36.0–46.0)
HEMOGLOBIN: 11.4 g/dL — AB (ref 12.0–15.0)
MCH: 26 pg (ref 26.0–34.0)
MCHC: 31.5 g/dL (ref 30.0–36.0)
MCV: 82.6 fL (ref 78.0–100.0)
Platelets: 292 10*3/uL (ref 150–400)
RBC: 4.38 MIL/uL (ref 3.87–5.11)
RDW: 15.8 % — ABNORMAL HIGH (ref 11.5–15.5)
WBC: 6.5 10*3/uL (ref 4.0–10.5)

## 2017-10-30 LAB — BASIC METABOLIC PANEL
Anion gap: 7 (ref 5–15)
BUN: 21 mg/dL — AB (ref 6–20)
CHLORIDE: 108 mmol/L (ref 101–111)
CO2: 26 mmol/L (ref 22–32)
Calcium: 9 mg/dL (ref 8.9–10.3)
Creatinine, Ser: 1.09 mg/dL — ABNORMAL HIGH (ref 0.44–1.00)
GFR calc Af Amer: 57 mL/min — ABNORMAL LOW (ref >60)
GFR calc non Af Amer: 49 mL/min — ABNORMAL LOW (ref >60)
Glucose, Bld: 108 mg/dL — ABNORMAL HIGH (ref 65–99)
POTASSIUM: 4.7 mmol/L (ref 3.5–5.1)
SODIUM: 141 mmol/L (ref 135–145)

## 2017-10-30 NOTE — Telephone Encounter (Signed)
Call placed to Medicare # [phone], regarding patient's wheelchair. Spoke with March Rummagearrie Roberts and informed her that patient was requesting provider to write a letter to insurance in order for them to fix her wheel chair without her having to pay out of pocket and asked her what the letter needed to say. Lyla SonCarrie informed me that any problem with medical equipment should be handled through the supplier company not them. Suggested that I contact supplier and ask them if letter is needed and what should be written on it.

## 2017-10-30 NOTE — ED Triage Notes (Signed)
Pt repots cold like S/S X few days, cough, nasal congestion, chills,.

## 2017-10-31 ENCOUNTER — Emergency Department (HOSPITAL_COMMUNITY)
Admission: EM | Admit: 2017-10-31 | Discharge: 2017-10-31 | Disposition: A | Discharge status: Home / Self Care | Payer: Medicare Other | Attending: Emergency Medicine | Admitting: Emergency Medicine

## 2017-10-31 DIAGNOSIS — Z5900 Homelessness unspecified: Secondary | ICD-10-CM

## 2017-10-31 DIAGNOSIS — J069 Acute upper respiratory infection, unspecified: Secondary | ICD-10-CM

## 2017-10-31 NOTE — ED Notes (Signed)
Pt departed in NAD.  

## 2017-10-31 NOTE — ED Provider Notes (Signed)
TIME SEEN: 4:01 AM  CHIEF COMPLAINT: Nasal congestion, dry cough  HPI: Patient is a 77 year old female with history of hypertension, homelessness who is well-known to our emergent 23 visits in the past 6 months who presents today with over a week of nasal congestion, dry cough.  No fevers.  Denies chills.  No chest pain or shortness of breath.  Has chronic peripheral edema which is unchanged.  States that [facility] and had a negative chest x-ray.  Patient currently homeless.  ROS: See HPI Constitutional: no fever  Eyes: no drainage  ENT: no runny nose   Cardiovascular:  no chest pain  Resp: no SOB  GI: no vomiting GU: no dysuria Integumentary: no rash  Allergy: no hives  Musculoskeletal: no leg swelling  Neurological: no slurred speech ROS otherwise negative  PAST MEDICAL HISTORY/PAST SURGICAL HISTORY:  Past Medical History:  Diagnosis Date  . Hip fracture (HCC)   . Homeless   . Hypertension   . Morbid obesity (HCC)   . Pelvic fracture (HCC)   . Post-partum depression     MEDICATIONS:  Prior to Admission medications   Medication Sig Start Date End Date Taking? Authorizing Provider  acetaminophen (TYLENOL) 500 MG tablet Take 2 tablets (1,000 mg total) by mouth every 6 (six) hours as needed for moderate pain. 09/22/17   Lizbeth BarkHairston, Mandesia R, FNP  albuterol (PROVENTIL HFA;VENTOLIN HFA) 108 (90 Base) MCG/ACT inhaler Inhale 90 puffs into the lungs 2 (two) times daily.    [provider]  cyclobenzaprine (FLEXERIL) 5 MG tablet Take 2 tablets (10 mg total) by mouth 2 (two) times daily as needed for muscle spasms. 09/22/17   Lizbeth BarkHairston, Mandesia R, FNP  ferrous sulfate 325 (65 FE) MG tablet Take 325 mg by mouth 2 (two) times daily. 09/19/17   [provider]  rivaroxaban (XARELTO) 20 MG TABS tablet Take 20 mg by mouth daily. 09/18/17   [provider]  vitamin B-12 (CYANOCOBALAMIN) 1000 MCG tablet Take 1,000 mcg by mouth every  morning. 09/19/17   [provider]    ALLERGIES:  Allergies  Allergen Reactions  . Diazepam Other (See Comments)    Other Reaction: Dry mouth  . Haldol [Haloperidol Lactate] Other (See Comments)    CATATONIC EPISODES    SOCIAL HISTORY:  Social History   Tobacco Use  . Smoking status: Former Smoker    Last attempt to quit: 11/09/2013    Years since quitting: 3.9  . Smokeless tobacco: Never Used  Substance Use Topics  . Alcohol use: No    FAMILY HISTORY: Family History  Problem Relation Age of Onset  . Heart failure Mother   . Heart failure Father     EXAM: BP (!) 180/97 (BP Location: Right Wrist)   Pulse (!) 101   Temp 98.7 F (37.1 C) (Oral)   Resp (!) 22   Ht 5\' 2"  (1.575 m)   Wt 127 kg (280 lb)   SpO2 94%   BMI 51.21 kg/m  CONSTITUTIONAL: Alert and oriented and responds appropriately to questions. Well-appearing; well-nourished HEAD: Normocephalic EYES: Conjunctivae clear, pupils appear equal, EOMI ENT: normal nose; moist mucous membranes NECK: Supple, no meningismus, no nuchal rigidity, no LAD  CARD: RRR; S1 and S2 appreciated; no murmurs, no clicks, no rubs, no gallops RESP: Normal chest excursion without splinting or tachypnea; breath sounds clear and equal bilaterally; no wheezes, no rhonchi, no rales, no hypoxia or respiratory distress, speaking full sentences ABD/GI:  Normal bowel sounds; non-distended; soft, non-tender, no rebound, no guarding, no peritoneal signs, no hepatosplenomegaly BACK:  The back appears normal and is non-tender to palpation, there is no CVA tenderness EXT: Normal ROM in all joints; non-tender to palpation; chronic edema of bilateral lower extremities but no redness, warmth, induration; normal capillary refill; no cyanosis, no calf tenderness or swelling, small wound noted to the lateral aspect of the left leg with no signs of surrounding infection and no drainage    SKIN: Normal color for age and race; warm; no rash NEURO:  Moves all extremities equally PSYCH: The patient's mood and manner are appropriate. Grooming and personal hygiene are appropriate.  MEDICAL DECISION MAKING: Patient here with symptoms of viral URI.  Labs obtained in triage are unremarkable.  She is afebrile and has no leukocytosis.  Chest x-ray is clear.  No sign of pulmonary edema.  She is not hypoxic.  I do not feel she needs antibiotics at this time.  Doubt the flu.  She has chronic edema bilateral lower extremities which appears unchanged from her baseline.  I feel she is safe to be discharged.  At this time, I do not feel there is any life-threatening condition present. I have reviewed and discussed all results (EKG, imaging, lab, urine as appropriate) and exam findings with patient/family. I have reviewed nursing notes and appropriate previous records.  I feel the patient is safe to be discharged home without further emergent workup and can continue workup as an outpatient as needed. Discussed usual and customary return precautions. Patient/family verbalize understanding and are comfortable with this plan.  Outpatient follow-up has been provided if needed. All questions have been answered.      Savvas Roper, Layla Maw, DO 10/31/17 [phone]

## 2017-11-02 NOTE — Telephone Encounter (Signed)
Attempted to contact the patient to inquire about the manufacturer of her power chair in order to provide assistance with repair. Call placed to # [phone] and the call dropped after 7 rings.   As per Guy Francoravia Benjamin, RN [facility], the patient told her that she doesn't have a phone and will just stop in the clinic to pick up the letter that she has requested. The patient had stated that there is no way to contact her.  The manufacturer needs to be determined prior to a letter being written and that information will need to be obtained when the patient contacts this clinic.

## 2017-11-03 ENCOUNTER — Telehealth: Payer: Self-pay | Admitting: Family Medicine

## 2017-11-03 ENCOUNTER — Emergency Department (HOSPITAL_COMMUNITY): Payer: Medicare Other

## 2017-11-03 ENCOUNTER — Other Ambulatory Visit: Payer: Self-pay

## 2017-11-03 ENCOUNTER — Encounter (HOSPITAL_COMMUNITY): Payer: Self-pay

## 2017-11-03 DIAGNOSIS — Z87891 Personal history of nicotine dependence: Secondary | ICD-10-CM | POA: Insufficient documentation

## 2017-11-03 DIAGNOSIS — I10 Essential (primary) hypertension: Secondary | ICD-10-CM | POA: Diagnosis not present

## 2017-11-03 DIAGNOSIS — Z7901 Long term (current) use of anticoagulants: Secondary | ICD-10-CM | POA: Insufficient documentation

## 2017-11-03 DIAGNOSIS — Z9114 Patient's other noncompliance with medication regimen: Secondary | ICD-10-CM | POA: Diagnosis not present

## 2017-11-03 DIAGNOSIS — J189 Pneumonia, unspecified organism: Secondary | ICD-10-CM | POA: Insufficient documentation

## 2017-11-03 DIAGNOSIS — Z590 Homelessness: Secondary | ICD-10-CM | POA: Diagnosis not present

## 2017-11-03 DIAGNOSIS — D649 Anemia, unspecified: Secondary | ICD-10-CM | POA: Insufficient documentation

## 2017-11-03 DIAGNOSIS — M79662 Pain in left lower leg: Secondary | ICD-10-CM | POA: Insufficient documentation

## 2017-11-03 DIAGNOSIS — M79661 Pain in right lower leg: Secondary | ICD-10-CM | POA: Diagnosis not present

## 2017-11-03 DIAGNOSIS — Z79899 Other long term (current) drug therapy: Secondary | ICD-10-CM | POA: Insufficient documentation

## 2017-11-03 DIAGNOSIS — R0600 Dyspnea, unspecified: Secondary | ICD-10-CM | POA: Diagnosis not present

## 2017-11-03 DIAGNOSIS — Z86711 Personal history of pulmonary embolism: Secondary | ICD-10-CM | POA: Insufficient documentation

## 2017-11-03 LAB — CBC
HCT: 33.9 % — ABNORMAL LOW (ref 36.0–46.0)
HEMOGLOBIN: 10.9 g/dL — AB (ref 12.0–15.0)
MCH: 26.4 pg (ref 26.0–34.0)
MCHC: 32.2 g/dL (ref 30.0–36.0)
MCV: 82.1 fL (ref 78.0–100.0)
Platelets: 258 10*3/uL (ref 150–400)
RBC: 4.13 MIL/uL (ref 3.87–5.11)
RDW: 15.9 % — ABNORMAL HIGH (ref 11.5–15.5)
WBC: 6.3 10*3/uL (ref 4.0–10.5)

## 2017-11-03 LAB — BASIC METABOLIC PANEL
ANION GAP: 10 (ref 5–15)
BUN: 13 mg/dL (ref 6–20)
CHLORIDE: 107 mmol/L (ref 101–111)
CO2: 23 mmol/L (ref 22–32)
Calcium: 8.4 mg/dL — ABNORMAL LOW (ref 8.9–10.3)
Creatinine, Ser: 0.98 mg/dL (ref 0.44–1.00)
GFR calc non Af Amer: 55 mL/min — ABNORMAL LOW (ref >60)
Glucose, Bld: 93 mg/dL (ref 65–99)
Potassium: 4 mmol/L (ref 3.5–5.1)
Sodium: 140 mmol/L (ref 135–145)

## 2017-11-03 LAB — I-STAT TROPONIN, ED: TROPONIN I, POC: 0 ng/mL (ref 0.00–0.08)

## 2017-11-03 NOTE — ED Triage Notes (Signed)
Pt presents with 1-2 day h/o muscle spasms in both legs.  Pt states "I've been pushing my stuff around and I need to talk to the orthopedic doctor".  Pt reports intermittent chest pain and cough.

## 2017-11-03 NOTE — Telephone Encounter (Signed)
Received a call from Trinna PostAlex, Warden/rangersocial worker intern at [employer], regarding patient's wheelchair. Alex mentioned that patient had spoken to a Medicare representative and they had informed her that she needed to ask provider to send them a referral to have her wheelchair fixed. Informed Alex that I spoke with a Medicare representative on Friday 1/11 and she informed me that we would need to contact the manufacturer of the wheelchair. Alex asked patient if she knew the name of manufacturer and patient said [employer] and the model of the wheel chair is [employer]azzy. Informed Alex that I will be contacting the company tomorrow to get any updates and that patient can call the office before 12 pm to speak with me since she has no phone number to reach her at. Alex and patient understood.

## 2017-11-04 ENCOUNTER — Emergency Department (HOSPITAL_COMMUNITY)
Admission: EM | Admit: 2017-11-04 | Discharge: 2017-11-04 | Disposition: A | Discharge status: Home / Self Care | Payer: Medicare Other | Attending: Emergency Medicine | Admitting: Emergency Medicine

## 2017-11-04 DIAGNOSIS — J189 Pneumonia, unspecified organism: Secondary | ICD-10-CM

## 2017-11-04 DIAGNOSIS — M7918 Myalgia, other site: Secondary | ICD-10-CM

## 2017-11-04 DIAGNOSIS — Z5900 Homelessness unspecified: Secondary | ICD-10-CM

## 2017-11-04 DIAGNOSIS — M79662 Pain in left lower leg: Secondary | ICD-10-CM

## 2017-11-04 DIAGNOSIS — D649 Anemia, unspecified: Secondary | ICD-10-CM

## 2017-11-04 DIAGNOSIS — M79661 Pain in right lower leg: Secondary | ICD-10-CM

## 2017-11-04 LAB — MAGNESIUM: Magnesium: 2 mg/dL (ref 1.7–2.4)

## 2017-11-04 MED ORDER — AMOXICILLIN 500 MG PO CAPS
1000.0000 mg | ORAL_CAPSULE | Freq: Once | ORAL | Status: AC
  Administered 2017-11-04: 1000 mg via ORAL
  Filled 2017-11-04: qty 2

## 2017-11-04 MED ORDER — ACETAMINOPHEN 500 MG PO TABS: 1000.0000 mg | ORAL_TABLET | Freq: Four times a day (QID) | ORAL | 2 refills | Status: AC | PRN

## 2017-11-04 MED ORDER — AMOXICILLIN 500 MG PO CAPS: 1000.0000 mg | ORAL_CAPSULE | Freq: Two times a day (BID) | ORAL | 0 refills | Status: DC

## 2017-11-04 MED ORDER — IBUPROFEN 800 MG PO TABS
800.0000 mg | ORAL_TABLET | Freq: Once | ORAL | Status: AC
  Administered 2017-11-04: 800 mg via ORAL
  Filled 2017-11-04: qty 1

## 2017-11-04 NOTE — ED Notes (Signed)
ED Provider at bedside. 

## 2017-11-04 NOTE — ED Provider Notes (Signed)
[facility]   CSN: [id number] Arrival date & time: 11/03/17  2156     History   Chief Complaint Bilateral leg pain  HPI Beth Hall is a 77 y.o. female.  The history is provided by the patient.  She has a history of hypertension, morbid obesity, homelessness and comes in complaining of cramps in both of her legs which developed over the course of the day.  She rates pain a 10/10.  Nothing makes them better, nothing makes it worse.  She denies numbness or tingling.  She denies pain in her back.  She has not taken anything for the pain.  She denies bowel or bladder dysfunction.  Past Medical History:  Diagnosis Date  . Hip fracture (HCC)   . Homeless   . Hypertension   . Morbid obesity (HCC)   . Pelvic fracture (HCC)   . Post-partum depression     Patient Active Problem List   Diagnosis Date Noted  . Pedal edema 06/03/2017  . Essential hypertension 06/03/2017  . Homelessness 06/03/2017  . Abrasions of multiple sites 06/03/2017    History reviewed. No pertinent surgical history.  OB History    No data available       Home Medications    Prior to Admission medications   Medication Sig Start Date End Date Taking? Authorizing Provider  acetaminophen (TYLENOL) 500 MG tablet Take 2 tablets (1,000 mg total) by mouth every 6 (six) hours as needed for moderate pain. 09/22/17   Lizbeth BarkHairston, Mandesia R, FNP  albuterol (PROVENTIL HFA;VENTOLIN HFA) 108 (90 Base) MCG/ACT inhaler Inhale 90 puffs into the lungs 2 (two) times daily.    [provider]  cyclobenzaprine (FLEXERIL) 5 MG tablet Take 2 tablets (10 mg total) by mouth 2 (two) times daily as needed for muscle spasms. 09/22/17   Lizbeth BarkHairston, Mandesia R, FNP  ferrous sulfate 325 (65 FE) MG tablet Take 325 mg by mouth 2 (two) times daily. 09/19/17   [provider]  rivaroxaban (XARELTO) 20 MG TABS tablet Take 20 mg by mouth daily. 09/18/17   [provider]  vitamin B-12 (CYANOCOBALAMIN) 1000 MCG tablet Take 1,000 mcg by mouth every morning. 09/19/17   [provider]    Family History Family History  Problem Relation Age of Onset  . Heart failure Mother   . Heart failure Father     Social History Social History   Tobacco Use  . Smoking status: Former Smoker    Last attempt to quit: 11/09/2013    Years since quitting: 3.9  . Smokeless tobacco: Never Used  Substance Use Topics  . Alcohol use: No  . Drug use: No     Allergies   Diazepam and Haldol [haloperidol lactate]   Review of Systems Review of Systems  All other systems reviewed and are negative.    Physical Exam Updated Vital Signs BP (!) 156/141 (BP Location: Left Arm)   Pulse 75   Temp 98.3 F (36.8 C) (Oral)   Resp 20   SpO2 94%   Physical Exam  Nursing note and vitals reviewed.  77 year old female, resting comfortably and in no acute distress. Vital signs are significant for hypertension. Oxygen saturation is 94%, which is normal. Head is normocephalic and atraumatic. PERRLA, EOMI. Oropharynx is clear. Neck is nontender and supple without adenopathy or JVD. Back is nontender and there is no CVA tenderness. Lungs are clear without rales, wheezes, or rhonchi. Chest is nontender.  Heart has regular rate and rhythm without murmur. Abdomen is soft, flat, nontender without masses or hepatosplenomegaly and peristalsis is normoactive. Extremities have 2+ edema, full range of motion is present. Skin is warm and dry without rash. Neurologic: Mental status is normal, cranial nerves are intact, there are no motor or sensory deficits.  ED Treatments / Results  Labs (all labs ordered are listed, but only abnormal results are displayed) Labs Reviewed  BASIC METABOLIC PANEL - Abnormal; Notable for the following components:      Result Value   Calcium 8.4 (*)    GFR calc non Af Amer 55 (*)    All other components within normal limits  CBC -  Abnormal; Notable for the following components:   Hemoglobin 10.9 (*)    HCT 33.9 (*)    RDW 15.9 (*)    All other components within normal limits  MAGNESIUM  I-STAT TROPONIN, ED    EKG  EKG Interpretation  Date/Time:  Tuesday November 03 2017 22:02:25 EST Ventricular Rate:  91 PR Interval:  154 QRS Duration: 84 QT Interval:  380 QTC Calculation: 467 R Axis:   70 Text Interpretation:  Normal sinus rhythm Nonspecific T wave abnormality Abnormal ECG When compared with ECG of 06/20/2017, HEART RATE has increased Confirmed by [name] ([id number]) on 11/03/2017 11:14:13 PM       Radiology Dg Chest 2 View  Result Date: 11/03/2017 CLINICAL DATA:  Dyspnea EXAM: CHEST  2 VIEW COMPARISON:  10/30/2017 chest radiograph. FINDINGS: Low lung volumes. Top-normal heart size. Aortic atherosclerosis. Otherwise normal mediastinal contour. No pneumothorax. No pleural effusion. No overt pulmonary edema. Suggestion of a new mild patchy left parahilar lung opacity. IMPRESSION: Limited hypoinspiratory radiographs. Suggestion of a mild patchy left parahilar lung opacity, cannot exclude a developing pneumonia. Recommend short-term follow-up PA and lateral chest radiographs with improved inspiratory effort. Electronically Signed   By: Delbert Phenix M.D.   On: 11/03/2017 22:54    Procedures Procedures (including critical care time)  Medications Ordered in ED Medications  ibuprofen (ADVIL,MOTRIN) tablet 800 mg (not administered)  amoxicillin (AMOXIL) capsule 1,000 mg (not administered)     Initial Impression / Assessment and Plan / ED Course  I have reviewed the triage vital signs and the nursing notes.  Pertinent labs & imaging results that were available during my care of the patient were reviewed by me and considered in my medical decision making (see chart for details).  Leg cramps of uncertain cause.  Potassium is noted to be normal, will check magnesium level.  Laboratory workup does show mild anemia  which is unchanged from baseline.  Chest x-ray shows possible early left perihilar pneumonia.  Patient is currently complaining that she is cold, even though she has been in the department for 3 hours.  She is not coughing currently.  Old records are reviewed, and she has 23 ED visits in the last 6 months, with most recent visit January 12 for upper respiratory infection.  She is given a dose of ibuprofen for pain.  Given chest x-ray findings, she is started on amoxicillin.  Magnesium is normal.  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  Following above-noted treatment, patient is noted to be sleeping comfortably.  She is advised to take acetaminophen for pain.  Discharged with prescription for amoxicillin.  Final Clinical Impressions(s) / ED Diagnoses  Final diagnoses:  Pain in both lower legs  Community acquired pneumonia of left lung, unspecified part of lung  Homeless  Normochromic normocytic anemia    ED Discharge Orders        Ordered    acetaminophen (TYLENOL) 500 MG tablet  Every 6 hours PRN     11/04/17 0231    amoxicillin (AMOXIL) 500 MG capsule  2 times daily     11/04/17 0231       [name], MD 11/04/17 [phone]

## 2017-11-04 NOTE — Telephone Encounter (Signed)
[facility] to [facility] #[phone], regarding patient's wheelchair. Spoke with Judeth CornfieldStephanie and she informed me that unfortunately they can't help patient with her wheelchair any longer. They have helped her on several occasions and Medicare has informed them that they will no longer be paying for the repairs of patient's wheelchair. Patient also has a balance with them that has not been paid.   Judeth CornfieldStephanie stated that we are not the first ones to contact them regarding patient's wheelchair and they have notifies providers and patient of this. Judeth CornfieldStephanie stated that we can contact [facility] or other companies for further assistance.

## 2017-11-05 ENCOUNTER — Telehealth: Payer: Self-pay

## 2017-11-05 DIAGNOSIS — Z9114 Patient's other noncompliance with medication regimen: Secondary | ICD-10-CM | POA: Diagnosis not present

## 2017-11-05 DIAGNOSIS — Z87891 Personal history of nicotine dependence: Secondary | ICD-10-CM | POA: Diagnosis not present

## 2017-11-05 DIAGNOSIS — I10 Essential (primary) hypertension: Secondary | ICD-10-CM | POA: Diagnosis not present

## 2017-11-05 DIAGNOSIS — Z993 Dependence on wheelchair: Secondary | ICD-10-CM | POA: Diagnosis not present

## 2017-11-05 DIAGNOSIS — M19071 Primary osteoarthritis, right ankle and foot: Secondary | ICD-10-CM | POA: Diagnosis not present

## 2017-11-05 DIAGNOSIS — Z590 Homelessness: Secondary | ICD-10-CM | POA: Diagnosis not present

## 2017-11-05 DIAGNOSIS — M79674 Pain in right toe(s): Secondary | ICD-10-CM | POA: Diagnosis not present

## 2017-11-05 NOTE — Telephone Encounter (Addendum)
Met with the patient when she came to the clinic today. She stated that she had to take a cab to the clinic as she no longer has a wheelchair. She does not have an appointment at [facility] today. She is currently using her rollator and arrived with 2 large bags that she said the cab driver brought them into the clinic for her.   She said that she left her red rolling bag at [facility] and asked this CM to check to see if it was there.  This CM called [facility] and spoke to [place] who confirmed that they have a red rolling bag. He said that he would put it behind the check out counter for her and would put the name Kaede on it.   She was inquiring about obtaining a new power chair. Explained to her that as per [facility], they are not able to help her with repairs and she has a balance with them.  [facility] also said that they have spoken to her about those issues. The patient then stated that she needs a new power chair. Explained to her that this CM will notify her provider of the request.   The patient stated that she can't go back to the [facility] and there are no white flags out for shelter and she  is not sure where she is staying tonight.  She inquired if the [facility] had emergency cab vouchers and was informed that there are no emergency cab vouchers and she did not have an appointment here today.She had paid for a cab to bring her to [facility] initially.   Message routed to Fredia Beets, [place] - pt requesting an order for a power chair

## 2017-11-05 NOTE — Telephone Encounter (Signed)
Call placed to Beth Cohnamellia Wood, RN CM to inquire if the hospital would provide a cab voucher for the patient as [facility], [facility] that [facility] . This CM was referred to [place] , SW who stated that she could provide a cab voucher for the patient to get to [facility]. The patient would then need to provide her own transportation after that. Beth MarshallKiera also stated that she can provide a list of shelter resources if needed.  This information was shared with the patient.

## 2017-11-09 ENCOUNTER — Telehealth: Payer: Self-pay | Admitting: Family Medicine

## 2017-11-09 ENCOUNTER — Other Ambulatory Visit: Payer: Self-pay | Admitting: Family Medicine

## 2017-11-09 ENCOUNTER — Telehealth: Payer: Self-pay

## 2017-11-09 NOTE — Telephone Encounter (Signed)
Pt called and left a voicemail stating that she needs a referral for a new power wheelchair. Pt stated she is staying and [facility] msg left 11/04/17. Please f/up

## 2017-11-09 NOTE — Telephone Encounter (Signed)
Message left for Zenia Residesebbie Roach, [facility] - power chair specialist inquiring if she is able to check the patient's eligibility for a new power chair. Call back requested to # [phone].  [facility] to [facility] to inquire if they are able to check the patient's eligibility for a new power chair. Spoke to [place] who stated they a prescription for the chair needs to be faxed to them.

## 2017-11-09 NOTE — Telephone Encounter (Signed)
Care Manager and PCP aware. Follow up in progress.

## 2017-11-10 ENCOUNTER — Telehealth: Payer: Self-pay | Admitting: Family Medicine

## 2017-11-10 NOTE — Telephone Encounter (Signed)
Call placed to Zenia Residesebbie Roach #[phone] regarding patient's power wheelchair. Spoke with Eunice Blaseebbie and she asked that I fax over patient's demographics and last ov notes to determine if patient would be eligible.   Faxed documents to [phone].

## 2017-11-12 ENCOUNTER — Telehealth: Payer: Self-pay | Admitting: Family Medicine

## 2017-11-12 NOTE — Telephone Encounter (Signed)
Call placed to Zenia ResidesDebbie Roach #[phone], from [facility] to inquire on the status of documents faxed regarding patient's wheel chair and any updates. Debbie informed me that unfortunately [facility] cannot intervene or help patient because it's been less than 5 years since patient received wheel chair [facility]. Patient received wheel chair back in September of 2017. Any repairs or problems with wheelchair would have to be communicated with [facility]. Explained to Eunice BlaseDebbie that I spoke with [facility] and they informed me that they cannot help patient any more. Eunice BlaseDebbie stated that unless there is a new diagnosis or patient's current condition has drastically changed there's nothing they or insurance can do.

## 2017-11-13 ENCOUNTER — Telehealth: Payer: Self-pay

## 2017-11-13 NOTE — Telephone Encounter (Signed)
Attempted to contact the patient to inform her that as per [facility], she is not eligible for a new power chair at this time.  Call placed to # [phone] and the call dropped after 6 rings.  Call placed to [facility],sp[facility] to [facility] who stated that no one is in the medical area today.  She said that she would transfer this call to Chales AbrahamsMary Ann, NP.  Voicemail message left requesting a call back to # [phone].

## 2017-11-16 ENCOUNTER — Telehealth: Payer: Self-pay

## 2017-11-16 NOTE — Telephone Encounter (Signed)
Message received from [place] at [facility] # [phone] returning CM call.   Call returned to [facility] and a voicemail message was left with CM call back # [phone]

## 2017-11-17 ENCOUNTER — Telehealth: Payer: Self-pay

## 2017-11-17 NOTE — Telephone Encounter (Signed)
Noted. Seen updated message from today that she is currently ineligible for power wheelchair.

## 2017-11-17 NOTE — Telephone Encounter (Signed)
Call placed to [facility] at [facility] # [phone] in attempt to contact the patient to inform her that she is not eligible for a new power chair. Albin FellingCarla stated that they have never seen the patient in the medical area and do not know her; but she may be using the other services at [facility]. This CM then spoke to [place]icia at [facility] front desk  and she took a message to have the patient contact [facility] and ask for Erskine SquibbJane.

## 2017-12-04 DIAGNOSIS — R05 Cough: Secondary | ICD-10-CM | POA: Diagnosis not present

## 2017-12-14 DIAGNOSIS — E559 Vitamin D deficiency, unspecified: Secondary | ICD-10-CM | POA: Diagnosis not present

## 2017-12-14 DIAGNOSIS — I2782 Chronic pulmonary embolism: Secondary | ICD-10-CM | POA: Diagnosis not present

## 2017-12-14 DIAGNOSIS — E039 Hypothyroidism, unspecified: Secondary | ICD-10-CM | POA: Diagnosis not present

## 2017-12-14 DIAGNOSIS — J449 Chronic obstructive pulmonary disease, unspecified: Secondary | ICD-10-CM | POA: Diagnosis not present

## 2017-12-14 DIAGNOSIS — K5900 Constipation, unspecified: Secondary | ICD-10-CM | POA: Diagnosis not present

## 2017-12-14 DIAGNOSIS — E785 Hyperlipidemia, unspecified: Secondary | ICD-10-CM | POA: Diagnosis not present

## 2017-12-14 DIAGNOSIS — D510 Vitamin B12 deficiency anemia due to intrinsic factor deficiency: Secondary | ICD-10-CM | POA: Diagnosis not present

## 2017-12-14 DIAGNOSIS — I10 Essential (primary) hypertension: Secondary | ICD-10-CM | POA: Diagnosis not present

## 2017-12-14 DIAGNOSIS — E119 Type 2 diabetes mellitus without complications: Secondary | ICD-10-CM | POA: Diagnosis not present

## 2017-12-18 DIAGNOSIS — E785 Hyperlipidemia, unspecified: Secondary | ICD-10-CM | POA: Diagnosis not present

## 2017-12-18 DIAGNOSIS — E119 Type 2 diabetes mellitus without complications: Secondary | ICD-10-CM | POA: Diagnosis not present

## 2017-12-18 DIAGNOSIS — D510 Vitamin B12 deficiency anemia due to intrinsic factor deficiency: Secondary | ICD-10-CM | POA: Diagnosis not present

## 2017-12-18 DIAGNOSIS — I10 Essential (primary) hypertension: Secondary | ICD-10-CM | POA: Diagnosis not present

## 2017-12-18 DIAGNOSIS — E559 Vitamin D deficiency, unspecified: Secondary | ICD-10-CM | POA: Diagnosis not present

## 2017-12-18 DIAGNOSIS — E039 Hypothyroidism, unspecified: Secondary | ICD-10-CM | POA: Diagnosis not present

## 2017-12-21 ENCOUNTER — Ambulatory Visit: Payer: Medicare Other | Admitting: Family Medicine

## 2018-01-14 DIAGNOSIS — I2782 Chronic pulmonary embolism: Secondary | ICD-10-CM | POA: Diagnosis not present

## 2018-01-14 DIAGNOSIS — I10 Essential (primary) hypertension: Secondary | ICD-10-CM | POA: Diagnosis not present

## 2018-01-14 DIAGNOSIS — E559 Vitamin D deficiency, unspecified: Secondary | ICD-10-CM | POA: Diagnosis not present

## 2018-01-14 DIAGNOSIS — J449 Chronic obstructive pulmonary disease, unspecified: Secondary | ICD-10-CM | POA: Diagnosis not present

## 2018-01-14 DIAGNOSIS — D510 Vitamin B12 deficiency anemia due to intrinsic factor deficiency: Secondary | ICD-10-CM | POA: Diagnosis not present

## 2018-01-14 DIAGNOSIS — K5900 Constipation, unspecified: Secondary | ICD-10-CM | POA: Diagnosis not present

## 2018-05-13 DIAGNOSIS — M6281 Muscle weakness (generalized): Secondary | ICD-10-CM | POA: Diagnosis not present

## 2018-05-13 DIAGNOSIS — M1380 Other specified arthritis, unspecified site: Secondary | ICD-10-CM | POA: Diagnosis not present

## 2018-05-13 DIAGNOSIS — I10 Essential (primary) hypertension: Secondary | ICD-10-CM | POA: Diagnosis not present

## 2018-05-13 DIAGNOSIS — R262 Difficulty in walking, not elsewhere classified: Secondary | ICD-10-CM | POA: Diagnosis not present

## 2018-05-13 DIAGNOSIS — R0600 Dyspnea, unspecified: Secondary | ICD-10-CM | POA: Diagnosis not present

## 2018-05-13 DIAGNOSIS — R52 Pain, unspecified: Secondary | ICD-10-CM | POA: Diagnosis not present

## 2018-05-13 DIAGNOSIS — J9611 Chronic respiratory failure with hypoxia: Secondary | ICD-10-CM | POA: Diagnosis not present

## 2018-05-13 DIAGNOSIS — R918 Other nonspecific abnormal finding of lung field: Secondary | ICD-10-CM | POA: Diagnosis not present

## 2018-05-13 DIAGNOSIS — Z9119 Patient's noncompliance with other medical treatment and regimen: Secondary | ICD-10-CM | POA: Diagnosis not present

## 2018-05-15 MED ORDER — NITROGLYCERIN 0.4 MG SL SUBL
.40 | SUBLINGUAL_TABLET | SUBLINGUAL | Status: DC
Start: ? — End: 2018-05-15

## 2018-05-15 MED ORDER — ALBUTEROL SULFATE (2.5 MG/3ML) 0.083% IN NEBU
2.50 | INHALATION_SOLUTION | RESPIRATORY_TRACT | Status: DC
Start: ? — End: 2018-05-15

## 2018-05-15 MED ORDER — GENERIC EXTERNAL MEDICATION
Status: DC
Start: ? — End: 2018-05-15

## 2018-05-15 MED ORDER — POLYETHYLENE GLYCOL 3350 17 G PO PACK
17.00 | PACK | ORAL | Status: DC
Start: ? — End: 2018-05-15

## 2018-05-15 MED ORDER — ACETAMINOPHEN 325 MG PO TABS
650.00 | ORAL_TABLET | ORAL | Status: DC
Start: ? — End: 2018-05-15

## 2018-05-15 MED ORDER — LISINOPRIL 10 MG PO TABS
10.00 | ORAL_TABLET | ORAL | Status: DC
Start: 2018-05-14 — End: 2018-05-15

## 2018-05-15 MED ORDER — AMLODIPINE BESYLATE 5 MG PO TABS
5.00 | ORAL_TABLET | ORAL | Status: DC
Start: 2018-05-14 — End: 2018-05-15

## 2018-05-15 MED ORDER — HEPARIN SODIUM (PORCINE) 5000 UNIT/ML IJ SOLN
5000.00 | INTRAMUSCULAR | Status: DC
Start: 2018-05-13 — End: 2018-05-15

## 2018-05-15 MED ORDER — SODIUM CHLORIDE 0.9 % IV SOLN
10.00 | INTRAVENOUS | Status: DC
Start: ? — End: 2018-05-15

## 2018-07-08 ENCOUNTER — Other Ambulatory Visit: Payer: Self-pay

## 2018-07-08 ENCOUNTER — Emergency Department (HOSPITAL_COMMUNITY)
Admission: EM | Admit: 2018-07-08 | Discharge: 2018-07-08 | Disposition: A | Discharge status: Home / Self Care | Payer: Medicare HMO

## 2018-07-09 ENCOUNTER — Encounter: Payer: Self-pay | Admitting: *Deleted

## 2018-07-09 NOTE — Progress Notes (Signed)
EDCM reviewed case and chart with Risk Management.  Upon review of the chart, EDCM and RM was able to locate signed documentation from 09/12/2017 that pt was given 30 days to retrieve her belongings (electric wheelchair).    [facility] and [facility] presented information to Ms Beth Hall and offered taxi voucher to depot and bus [place].  Pt verbalized understanding of why wheelchair is not in our possession at this time.  Pt agreeable to plan [place]. No further EDCM or EDSW needs identified at this time.

## 2018-07-09 NOTE — Progress Notes (Signed)
Pt in ED inquiring about power wheelchair she left here several months ago.  She wants serial number from the battery so she can have it replaced or repaired. EDCM looked in Biohazard Room (where [facility] stored) and it was not there.  EDCM was informed that Biohazard Room was purged and all DME with pt identification attached (hers had her name and MRN on it) was contacted or attempted contact for pt retrieval and all other DME was donated or discarded.  EDCM advised by director of TOC to contact Risk Management for next steps. EDCM awaiting call from Risk Management on how to proceed.

## 2018-07-16 DIAGNOSIS — M79605 Pain in left leg: Secondary | ICD-10-CM | POA: Diagnosis not present

## 2018-07-26 DIAGNOSIS — Z111 Encounter for screening for respiratory tuberculosis: Secondary | ICD-10-CM | POA: Diagnosis not present

## 2018-07-26 DIAGNOSIS — M6281 Muscle weakness (generalized): Secondary | ICD-10-CM | POA: Diagnosis not present

## 2018-07-26 DIAGNOSIS — F99 Mental disorder, not otherwise specified: Secondary | ICD-10-CM | POA: Diagnosis not present

## 2018-07-26 DIAGNOSIS — R918 Other nonspecific abnormal finding of lung field: Secondary | ICD-10-CM | POA: Diagnosis not present

## 2018-07-26 DIAGNOSIS — F4310 Post-traumatic stress disorder, unspecified: Secondary | ICD-10-CM | POA: Diagnosis not present

## 2018-07-26 DIAGNOSIS — L03116 Cellulitis of left lower limb: Secondary | ICD-10-CM | POA: Diagnosis not present

## 2018-07-26 DIAGNOSIS — K5780 Diverticulitis of intestine, part unspecified, with perforation and abscess without bleeding: Secondary | ICD-10-CM | POA: Diagnosis not present

## 2018-07-26 DIAGNOSIS — L0390 Cellulitis, unspecified: Secondary | ICD-10-CM | POA: Diagnosis not present

## 2018-07-26 DIAGNOSIS — I10 Essential (primary) hypertension: Secondary | ICD-10-CM | POA: Diagnosis not present

## 2018-07-26 DIAGNOSIS — J9611 Chronic respiratory failure with hypoxia: Secondary | ICD-10-CM | POA: Diagnosis not present

## 2018-07-26 DIAGNOSIS — M1380 Other specified arthritis, unspecified site: Secondary | ICD-10-CM | POA: Diagnosis not present

## 2018-07-26 DIAGNOSIS — R601 Generalized edema: Secondary | ICD-10-CM | POA: Diagnosis not present

## 2018-07-26 DIAGNOSIS — Z9119 Patient's noncompliance with other medical treatment and regimen: Secondary | ICD-10-CM | POA: Diagnosis not present

## 2018-07-26 DIAGNOSIS — R0600 Dyspnea, unspecified: Secondary | ICD-10-CM | POA: Diagnosis not present

## 2018-07-26 DIAGNOSIS — Z86711 Personal history of pulmonary embolism: Secondary | ICD-10-CM | POA: Diagnosis not present

## 2018-07-26 DIAGNOSIS — N179 Acute kidney failure, unspecified: Secondary | ICD-10-CM | POA: Diagnosis not present

## 2018-07-26 DIAGNOSIS — N814 Uterovaginal prolapse, unspecified: Secondary | ICD-10-CM | POA: Diagnosis not present

## 2018-07-26 DIAGNOSIS — R262 Difficulty in walking, not elsewhere classified: Secondary | ICD-10-CM | POA: Diagnosis not present

## 2018-07-26 DIAGNOSIS — L97329 Non-pressure chronic ulcer of left ankle with unspecified severity: Secondary | ICD-10-CM | POA: Diagnosis not present

## 2018-07-26 DIAGNOSIS — G8311 Monoplegia of lower limb affecting right dominant side: Secondary | ICD-10-CM | POA: Diagnosis not present

## 2018-07-26 DIAGNOSIS — R52 Pain, unspecified: Secondary | ICD-10-CM | POA: Diagnosis not present

## 2018-07-26 DIAGNOSIS — S91002D Unspecified open wound, left ankle, subsequent encounter: Secondary | ICD-10-CM | POA: Diagnosis not present

## 2018-07-26 DIAGNOSIS — K219 Gastro-esophageal reflux disease without esophagitis: Secondary | ICD-10-CM | POA: Diagnosis not present

## 2018-08-18 DIAGNOSIS — R262 Difficulty in walking, not elsewhere classified: Secondary | ICD-10-CM | POA: Diagnosis not present

## 2018-08-18 DIAGNOSIS — R2681 Unsteadiness on feet: Secondary | ICD-10-CM | POA: Diagnosis not present

## 2018-08-18 DIAGNOSIS — Z6841 Body Mass Index (BMI) 40.0 and over, adult: Secondary | ICD-10-CM | POA: Diagnosis not present

## 2018-08-18 DIAGNOSIS — M6281 Muscle weakness (generalized): Secondary | ICD-10-CM | POA: Diagnosis not present

## 2018-08-18 DIAGNOSIS — I10 Essential (primary) hypertension: Secondary | ICD-10-CM | POA: Diagnosis not present

## 2018-08-18 DIAGNOSIS — J449 Chronic obstructive pulmonary disease, unspecified: Secondary | ICD-10-CM | POA: Diagnosis not present

## 2018-08-18 DIAGNOSIS — N183 Chronic kidney disease, stage 3 (moderate): Secondary | ICD-10-CM | POA: Diagnosis not present

## 2018-09-15 IMAGING — CR DG HIP (WITH OR WITHOUT PELVIS) 2-3V*L*
3 series · 3 of 3 positions shown · non-contrast
Comparison: Pelvis radiograph 05/21/2017

CLINICAL DATA: Fall yesterday trying to push chair.  Left hip pain.

EXAM:
DG HIP (WITH OR WITHOUT PELVIS) 2-3V LEFT

[hip ap]
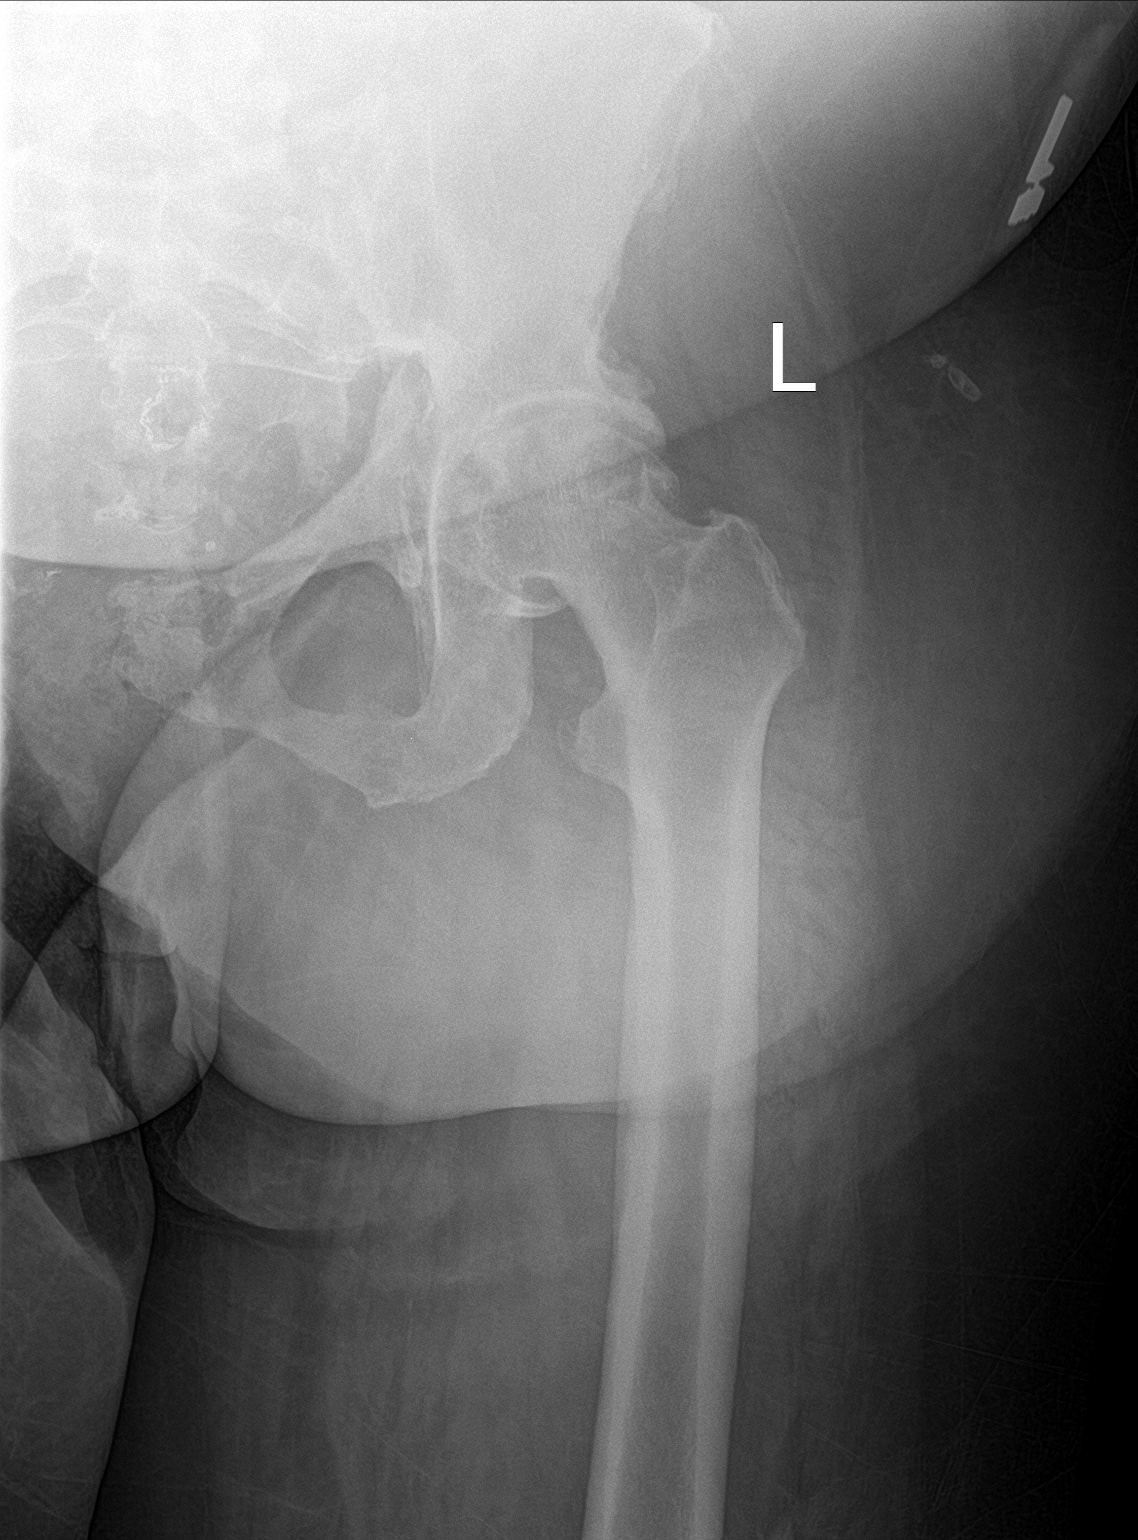

[hip lat]
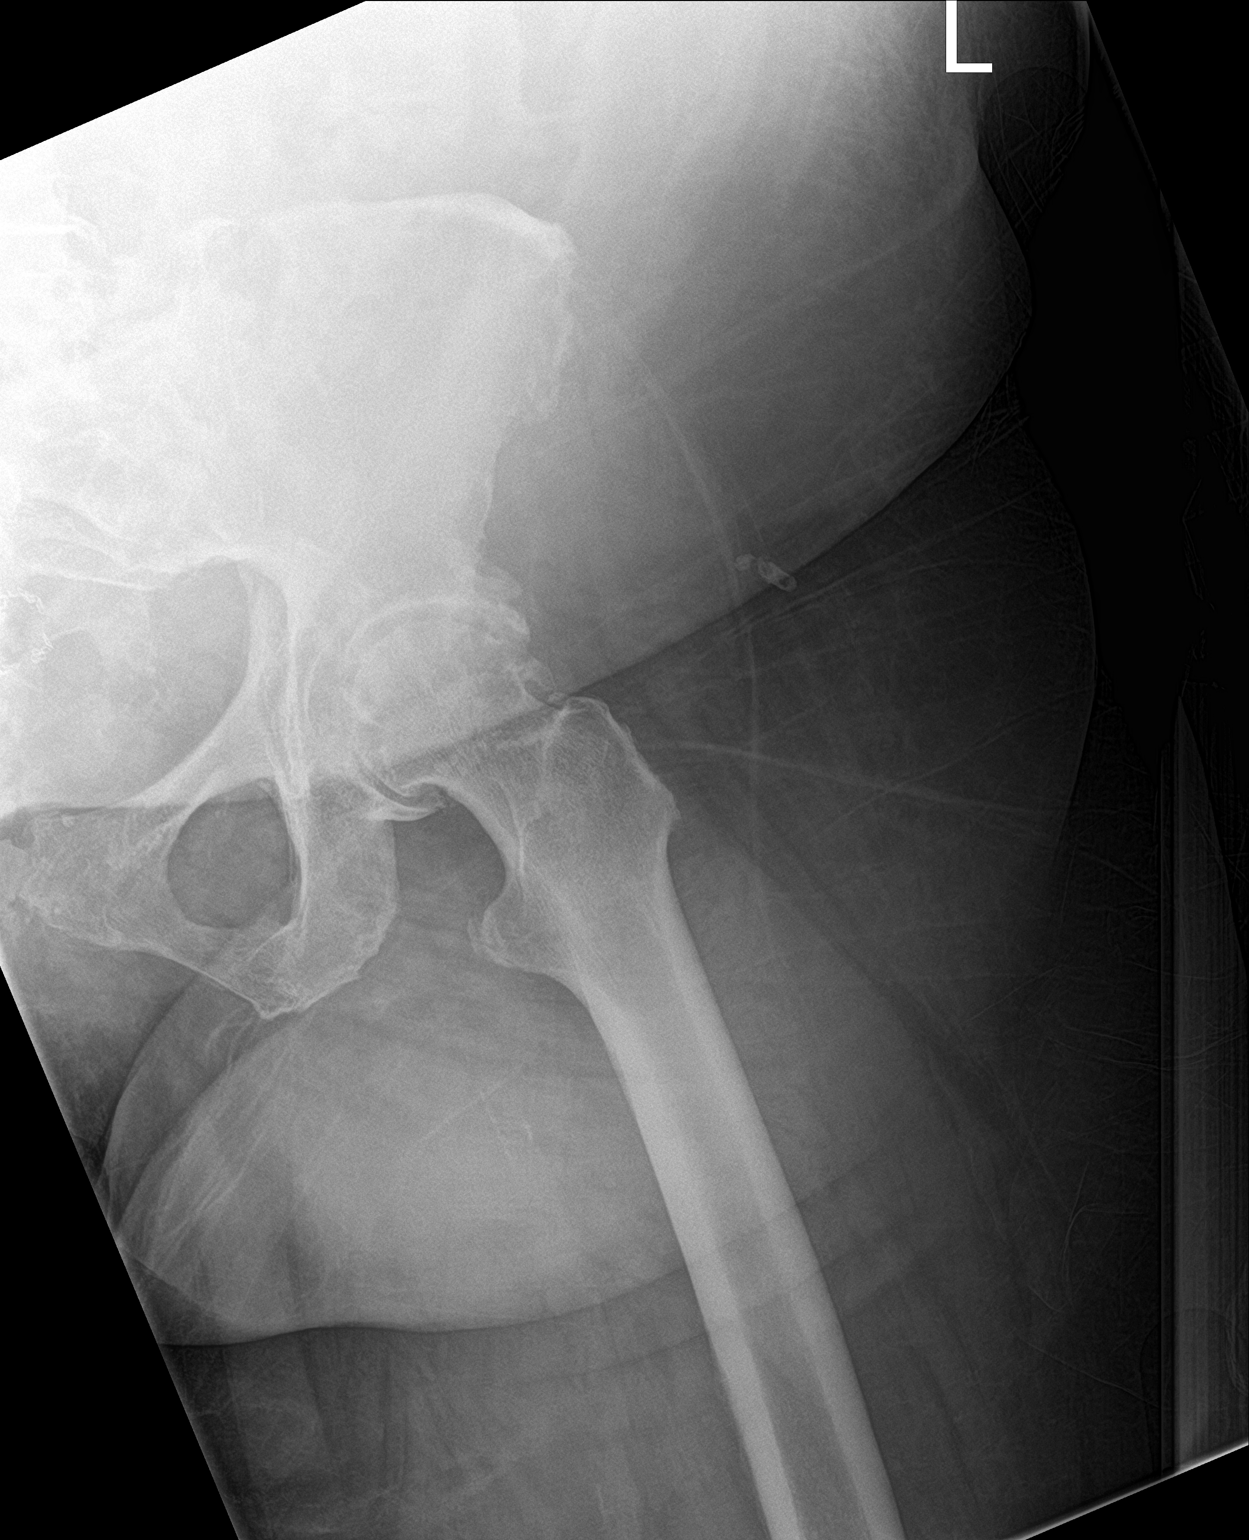

[pelvis ap]
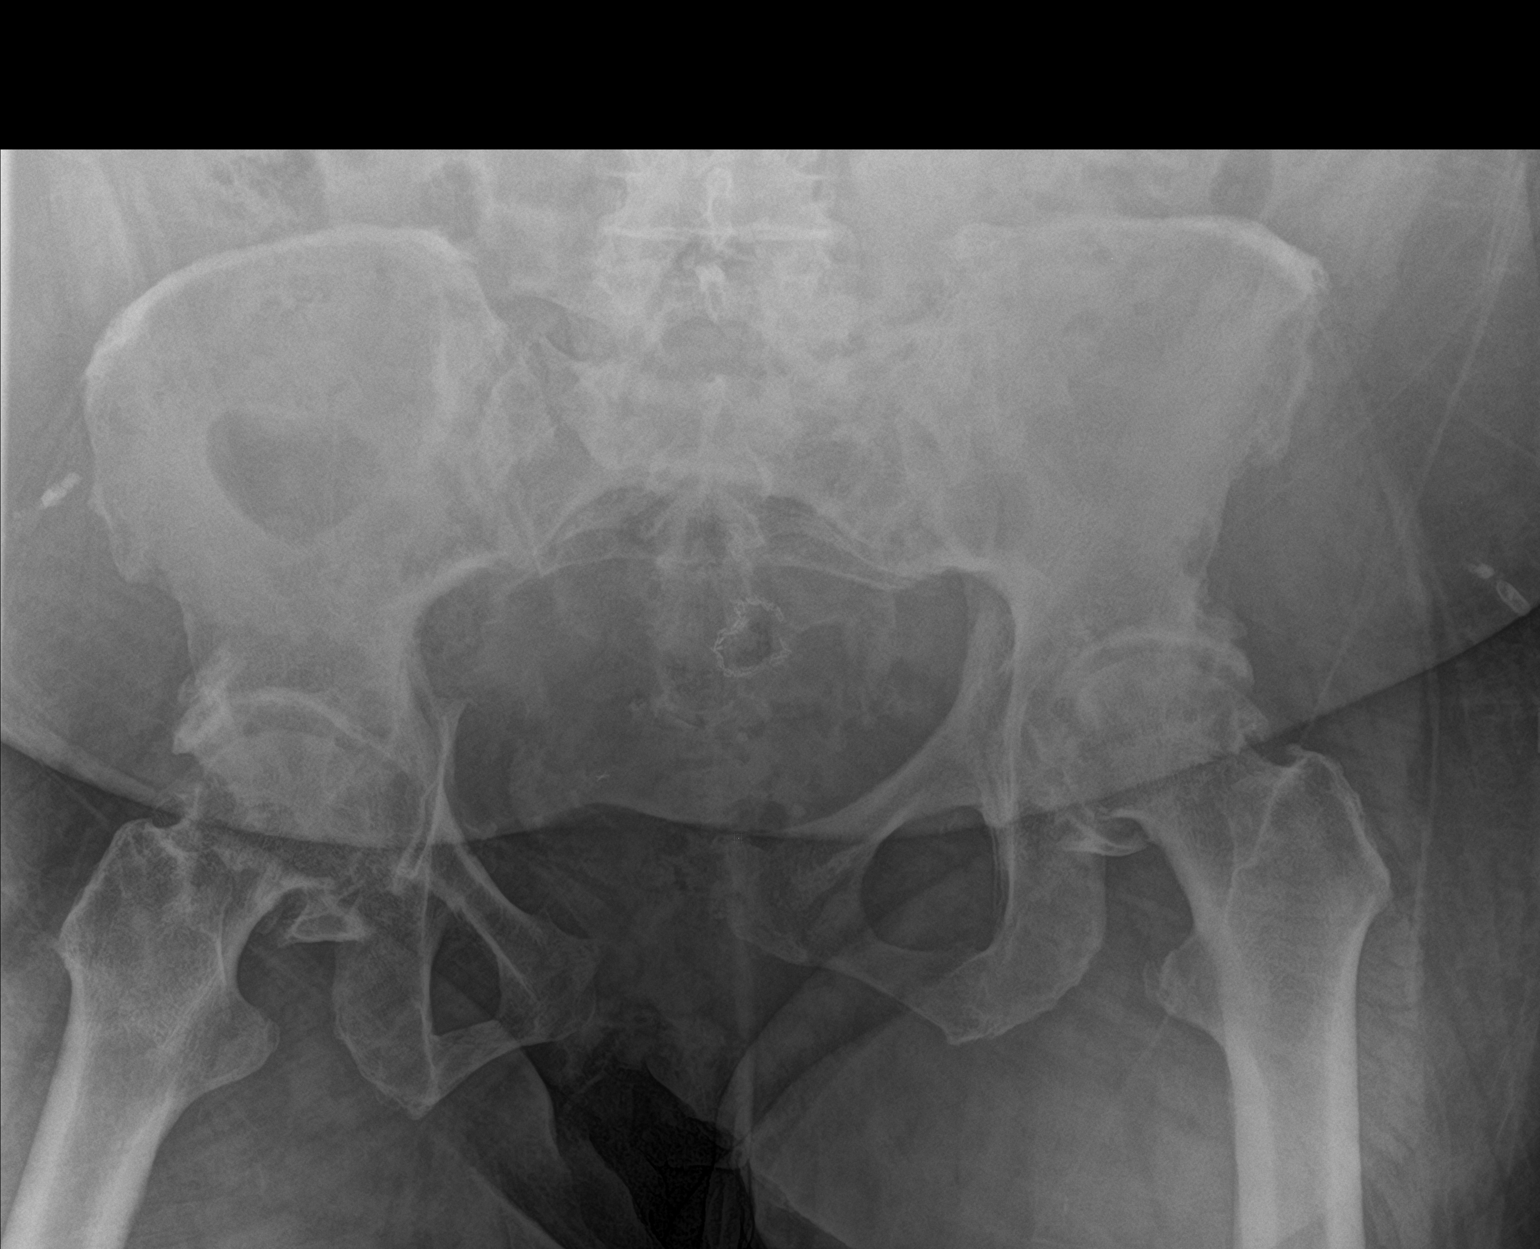

[3 of 3 positions shown; findings below may reference images not displayed]

FINDINGS: Chronic widening and pubic symphysis as 3.5 cm with symphyseal
offset, unchanged. Disruption of bilateral ilioischial lines,
unchanged on the right, of uncertain acuity on the left due to
differences in positioning. Advanced osteoarthritis of both hips is
unchanged from prior exam. Chronic enthesopathic changes are noted.
IMPRESSION: 1. Disruption of bilateral ilioischial lines suggesting acetabular
fractures, this is chronic on the right as compared to 05/21/2017
exam. Acuity is uncertain on the left, as history was not previously
well-visualized.
2. Chronic pubic symphyseal widening.
3. Advanced osteoarthritis of both hips.

## 2018-09-15 IMAGING — CR DG ANKLE COMPLETE 3+V*L*
3 series · 3 of 3 positions shown · non-contrast
Comparison: Radiographs 05/26/2017, 02/01/2017

CLINICAL DATA: Left ankle pain after fall yesterday trying to push
chair.

EXAM:
LEFT ANKLE COMPLETE - 3+ VIEW

[ankle ap]
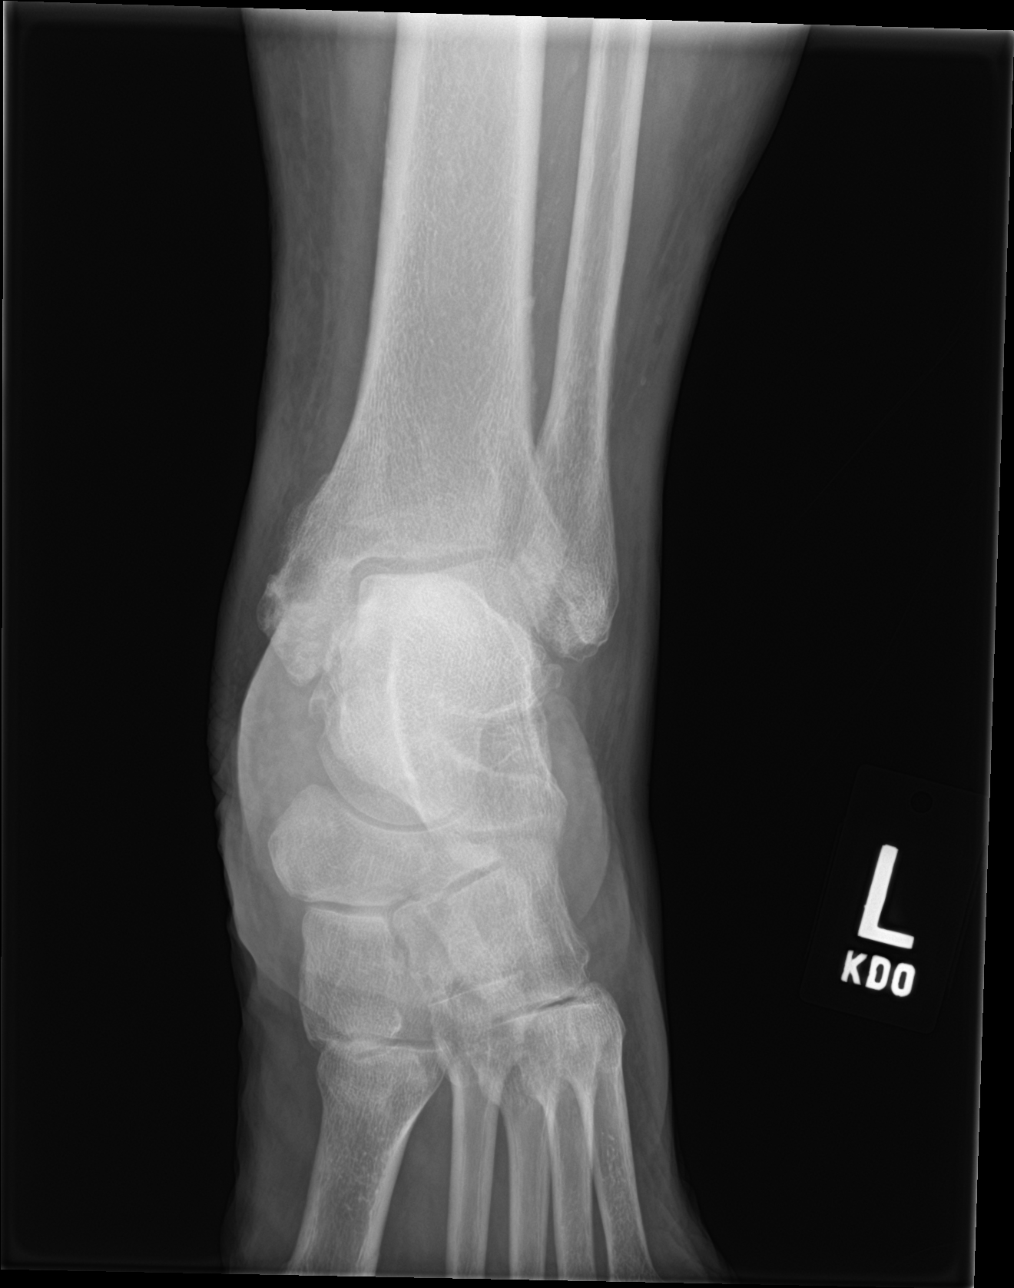

[ankle obl]
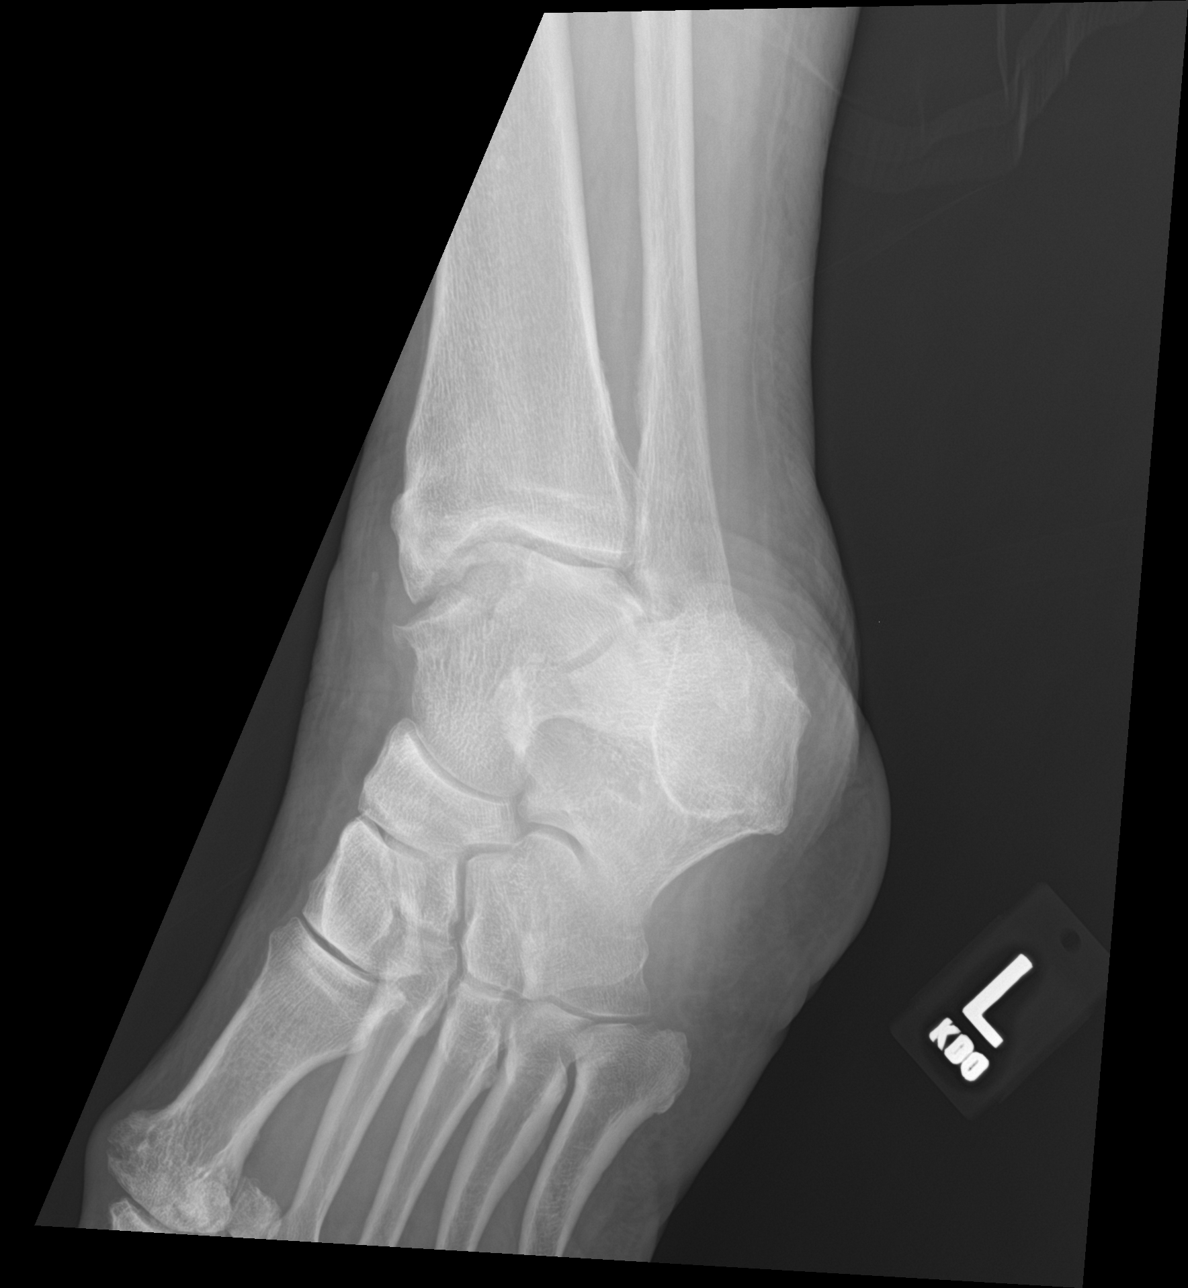

[ankle lat]
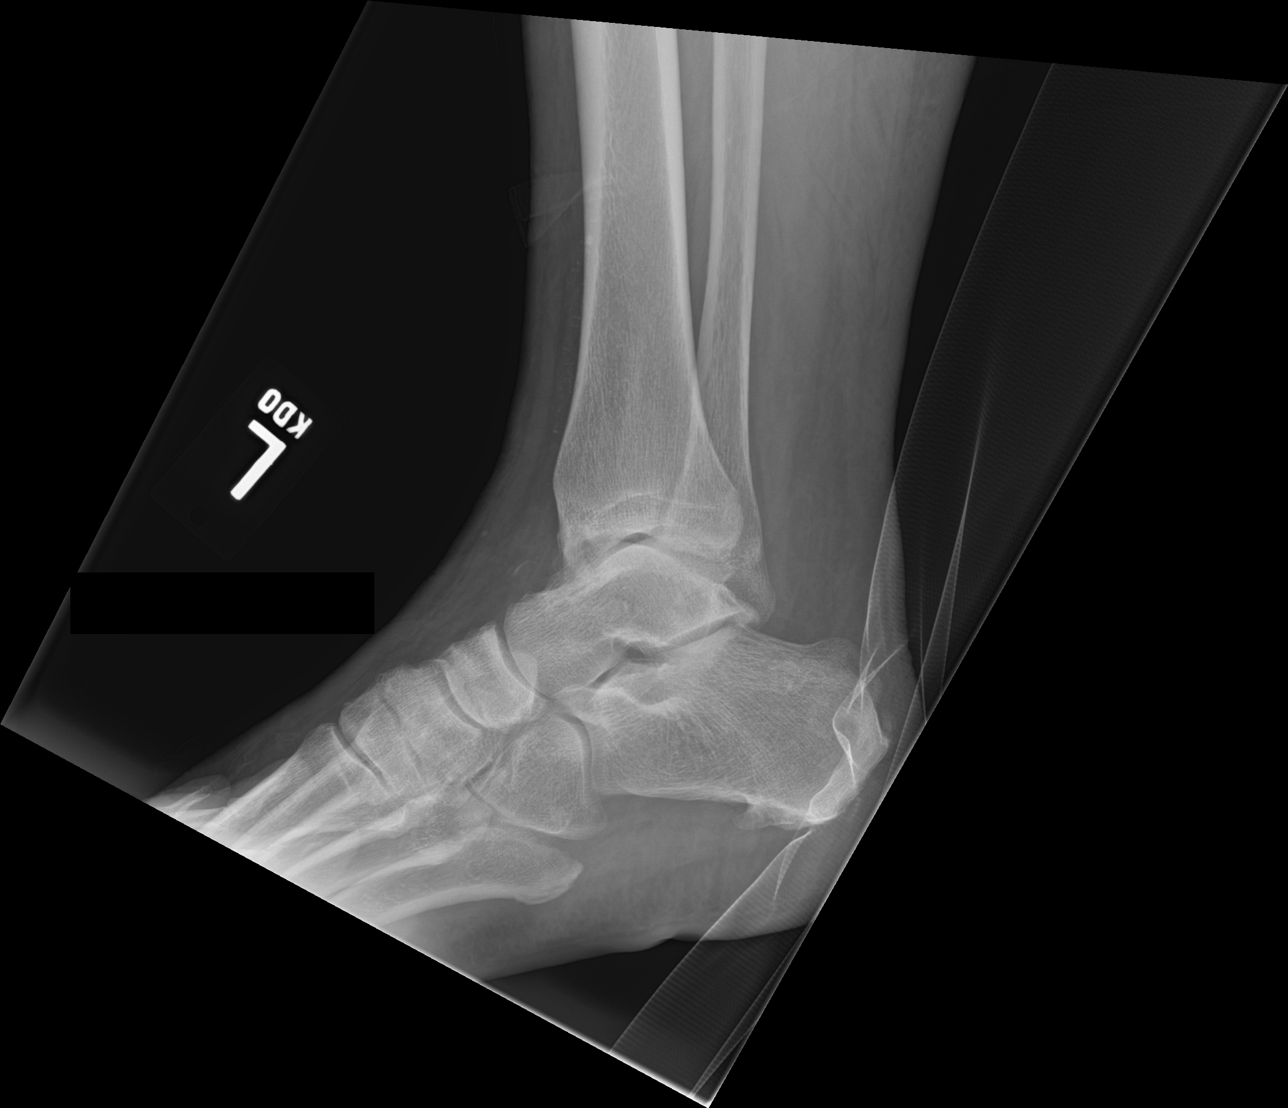

[3 of 3 positions shown; findings below may reference images not displayed]

FINDINGS: There is no evidence of fracture, dislocation, or joint effusion.
Tibiotalar osteoarthritis. Chronic curvilinear density about the
dorsal talus. Again seen plantar calcaneal spur. Soft tissues are
unchanged with chronic edema and skin thickening.
IMPRESSION: No fracture or acute osseous abnormality of the left ankle.

## 2018-10-01 DIAGNOSIS — R531 Weakness: Secondary | ICD-10-CM | POA: Diagnosis not present

## 2019-03-05 ENCOUNTER — Emergency Department (HOSPITAL_COMMUNITY)
Admission: EM | Admit: 2019-03-05 | Discharge: 2019-03-05 | Disposition: A | Discharge status: Home / Self Care | Payer: Medicare HMO | Attending: Emergency Medicine | Admitting: Emergency Medicine

## 2019-03-05 ENCOUNTER — Other Ambulatory Visit: Payer: Self-pay

## 2019-03-05 ENCOUNTER — Encounter (HOSPITAL_COMMUNITY): Payer: Self-pay | Admitting: Emergency Medicine

## 2019-03-05 DIAGNOSIS — Z79899 Other long term (current) drug therapy: Secondary | ICD-10-CM | POA: Diagnosis not present

## 2019-03-05 DIAGNOSIS — Z888 Allergy status to other drugs, medicaments and biological substances status: Secondary | ICD-10-CM | POA: Diagnosis not present

## 2019-03-05 DIAGNOSIS — Z7951 Long term (current) use of inhaled steroids: Secondary | ICD-10-CM | POA: Diagnosis not present

## 2019-03-05 DIAGNOSIS — Z5900 Homelessness unspecified: Secondary | ICD-10-CM

## 2019-03-05 DIAGNOSIS — Z87891 Personal history of nicotine dependence: Secondary | ICD-10-CM | POA: Insufficient documentation

## 2019-03-05 DIAGNOSIS — Z9981 Dependence on supplemental oxygen: Secondary | ICD-10-CM | POA: Insufficient documentation

## 2019-03-05 DIAGNOSIS — Z8249 Family history of ischemic heart disease and other diseases of the circulatory system: Secondary | ICD-10-CM | POA: Insufficient documentation

## 2019-03-05 DIAGNOSIS — I10 Essential (primary) hypertension: Secondary | ICD-10-CM | POA: Insufficient documentation

## 2019-03-05 DIAGNOSIS — Z7901 Long term (current) use of anticoagulants: Secondary | ICD-10-CM | POA: Insufficient documentation

## 2019-03-05 NOTE — ED Triage Notes (Signed)
Patient is complaining of her oxygen needs to be recharged and she states she has high blood pressure.

## 2019-03-05 NOTE — ED Provider Notes (Signed)
[facility]-EMERGENCY DEPT Provider Note   CSN: [id number] Arrival date & time: 03/05/19  2125    History   Chief Complaint Chief Complaint  Patient presents with  . Patient homeless and needs her oxygen tank to be recharged.  . Hypertension    HPI Beth Hall is a 78 y.o. female.     HPI Patient is a 78 year old female who is currently without a place to stay.  She is on home oxygen.  She states that she does not want to stay in a shelter.  She needs to take the bus to her extended stay hotel where she believes a check is been dropped off.  Currently they will not allow her to stay because she owes money but she states she will be able to pay this when she receives her check.  She has no medical complaints.  She states she needs a plug to plug her oxygen concentrator into.   Past Medical History:  Diagnosis Date  . Hip fracture (HCC)   . Homeless   . Hypertension   . Morbid obesity (HCC)   . Pelvic fracture (HCC)   . Post-partum depression     Patient Active Problem List   Diagnosis Date Noted  . Pedal edema 06/03/2017  . Essential hypertension 06/03/2017  . Homelessness 06/03/2017  . Abrasions of multiple sites 06/03/2017    History reviewed. No pertinent surgical history.   OB History   No obstetric history on file.      Home Medications    Prior to Admission medications   Medication Sig Start Date End Date Taking? Authorizing Provider  acetaminophen (TYLENOL) 500 MG tablet Take 2 tablets (1,000 mg total) by mouth every 6 (six) hours as needed for moderate pain. 11/04/17   Dione BoozeGlick, David, MD  albuterol (PROVENTIL HFA;VENTOLIN HFA) 108 (90 Base) MCG/ACT inhaler Inhale 90 puffs into the lungs 2 (two) times daily.    [provider]  amoxicillin (AMOXIL) 500 MG capsule Take 2 capsules (1,000 mg total) by mouth 2 (two) times daily. 11/04/17   Dione BoozeGlick, David, MD  ferrous sulfate 325 (65 FE) MG tablet Take 325 mg by mouth 2 (two) times  daily. 09/19/17   [provider]  rivaroxaban (XARELTO) 20 MG TABS tablet Take 20 mg by mouth daily. 09/18/17   [provider]  vitamin B-12 (CYANOCOBALAMIN) 1000 MCG tablet Take 1,000 mcg by mouth every morning. 09/19/17   [provider]    Family History Family History  Problem Relation Age of Onset  . Heart failure Mother   . Heart failure Father     Social History Social History   Tobacco Use  . Smoking status: Former Smoker    Last attempt to quit: 11/09/2013    Years since quitting: 5.3  . Smokeless tobacco: Never Used  Substance Use Topics  . Alcohol use: No  . Drug use: No     Allergies   Haloperidol; Heparin; Diazepam; and Haloperidol lactate   Review of Systems Review of Systems  All other systems reviewed and are negative.    Physical Exam Updated Vital Signs BP (!) 182/77 (BP Location: Right Arm)   Pulse 62   Temp 98.2 F (36.8 C) (Oral)   Resp 18   SpO2 100%   Physical Exam Vitals signs and nursing note reviewed.  Constitutional:      Appearance: She is well-developed.  HENT:     Head: Normocephalic.  Neck:     Musculoskeletal:  Normal range of motion.  Pulmonary:     Effort: Pulmonary effort is normal.  Abdominal:     General: There is no distension.  Musculoskeletal: Normal range of motion.  Neurological:     Mental Status: She is alert and oriented to person, place, and time.      ED Treatments / Results  Labs (all labs ordered are listed, but only abnormal results are displayed) Labs Reviewed - No data to display  EKG None  Radiology No results found.  Procedures Procedures (including critical care time)  Medications Ordered in ED Medications - No data to display   Initial Impression / Assessment and Plan / ED Course  I have reviewed the triage vital signs and the nursing notes.  Pertinent labs & imaging results that were available during my care of the patient were reviewed by me and  considered in my medical decision making (see chart for details).        Patient is well-appearing at this time.  She has no medical complaints.  She has been given information on homeless shelters.  She has been told she can stay in the emergency department waiting room tonight and use are outlet.  The buses will be running in the morning and cost no money at this time.  She states she will take the bus in the morning to her extended stay hotel where she will see if her check came in.  She states currently she does not want to stay at a homeless shelter.  I spoke with her daughter who lives in [place] who states that these of the choices that her mother makes.  They state that they have given up on her.  The patient's daughter or any other family members are able to come pick the patient up from the ER at this time.  Patient understands and agreeable to the plan.  Final Clinical Impressions(s) / ED Diagnoses   Final diagnoses:  Homeless    ED Discharge Orders    None       [name], MD 03/05/19 2228

## 2019-03-06 ENCOUNTER — Other Ambulatory Visit: Payer: Self-pay

## 2019-03-06 ENCOUNTER — Encounter (HOSPITAL_COMMUNITY): Payer: Self-pay

## 2019-03-06 ENCOUNTER — Emergency Department (HOSPITAL_COMMUNITY): Admission: EM | Admit: 2019-03-06 | Discharge: 2019-03-06 | Discharge status: Absent without leave | Payer: Medicare HMO

## 2019-03-06 ENCOUNTER — Emergency Department (HOSPITAL_COMMUNITY)
Admission: EM | Admit: 2019-03-06 | Discharge: 2019-03-07 | Disposition: A | Discharge status: Home / Self Care | Payer: Medicare HMO | Attending: Emergency Medicine | Admitting: Emergency Medicine

## 2019-03-06 DIAGNOSIS — I10 Essential (primary) hypertension: Secondary | ICD-10-CM | POA: Insufficient documentation

## 2019-03-06 DIAGNOSIS — Z7901 Long term (current) use of anticoagulants: Secondary | ICD-10-CM | POA: Insufficient documentation

## 2019-03-06 DIAGNOSIS — Z5900 Homelessness unspecified: Secondary | ICD-10-CM

## 2019-03-06 DIAGNOSIS — Z87891 Personal history of nicotine dependence: Secondary | ICD-10-CM | POA: Insufficient documentation

## 2019-03-06 DIAGNOSIS — Z79899 Other long term (current) drug therapy: Secondary | ICD-10-CM | POA: Insufficient documentation

## 2019-03-06 DIAGNOSIS — Z008 Encounter for other general examination: Secondary | ICD-10-CM | POA: Diagnosis present

## 2019-03-06 DIAGNOSIS — J9610 Chronic respiratory failure, unspecified whether with hypoxia or hypercapnia: Secondary | ICD-10-CM | POA: Insufficient documentation

## 2019-03-06 DIAGNOSIS — Z9981 Dependence on supplemental oxygen: Secondary | ICD-10-CM | POA: Insufficient documentation

## 2019-03-06 MED ORDER — AMLODIPINE BESYLATE 5 MG PO TABS
10.0000 mg | ORAL_TABLET | Freq: Every day | ORAL | Status: DC
  Administered 2019-03-07: 04:00:00 10 mg via ORAL
  Filled 2019-03-06 (×2): qty 2

## 2019-03-06 NOTE — ED Notes (Addendum)
Pt reports that she is from [place] and came here earlier this month to try and get into [facility]  [place] stay, but has not been able to get in contact with them to make arrangments.  She is also trying to find out where her stimulus money has been sent.  Pt to stay over night to see social work in the morning.  Pt on O2 via O2 concentrator, Pt also reports that she has htn, but she does not take medication for it.  Pt from triage via w/c with 3 Bags of belongings, rollator, and o2 concentrator.

## 2019-03-06 NOTE — ED Notes (Signed)
[name] at bedside to speak with pt.  Updated MD that the buses stop running at 6pm on Sunday.  Pt pending Child psychotherapist for am.

## 2019-03-06 NOTE — ED Triage Notes (Signed)
Patient waiting for social working in A.M.

## 2019-03-06 NOTE — ED Provider Notes (Signed)
[facility]-EMERGENCY DEPT Provider Note   CSN: [id number] Arrival date & time: 03/06/19  [id number]    History   Chief Complaint No chief complaint on file.   HPI Beth Hall is a 78 y.o. female.     HPI   78 year old female with a history of hypertension, morbid obesity, prior respiratory failure now on 2 L of oxygen, homelessness, who presents reportedly at the request of social work for assistance in disposition for home oxygen.  Patient was seen in the emergency department last night, and was discharged with plan to take the bus to go to an extended stay hotel, however she was unable to get to the bus station as she is not able to ambulate well enough even with her walker to get to the bus station.  Last night, the buses were not running, and she spent the night in the lobby, with plan to go the bus station this morning, however was unable to get there.  Reportedly she spoke with a Emergency planning/management officerpolice officer and social worker who brought her back into the emergency department to assist with placement and social work evaluation in the morning.  She denies any medical concerns, however does need a place to plug in and charge her oxygen concentrator which is why social work told her to come back. Patient reports she just wants a ride to the bus station but the bus station is no longer running at this time.     Past Medical History:  Diagnosis Date  . Hip fracture (HCC)   . Homeless   . Hypertension   . Morbid obesity (HCC)   . Pelvic fracture (HCC)   . Post-partum depression     Patient Active Problem List   Diagnosis Date Noted  . Pedal edema 06/03/2017  . Essential hypertension 06/03/2017  . Homelessness 06/03/2017  . Abrasions of multiple sites 06/03/2017    History reviewed. No pertinent surgical history.   OB History   No obstetric history on file.      Home Medications    Prior to Admission medications   Medication Sig Start Date End Date Taking?  Authorizing Provider  acetaminophen (TYLENOL) 500 MG tablet Take 2 tablets (1,000 mg total) by mouth every 6 (six) hours as needed for moderate pain. 11/04/17   Dione BoozeGlick, David, MD  albuterol (PROVENTIL HFA;VENTOLIN HFA) 108 (90 Base) MCG/ACT inhaler Inhale 90 puffs into the lungs 2 (two) times daily.    [provider]  amoxicillin (AMOXIL) 500 MG capsule Take 2 capsules (1,000 mg total) by mouth 2 (two) times daily. 11/04/17   Dione BoozeGlick, David, MD  ferrous sulfate 325 (65 FE) MG tablet Take 325 mg by mouth 2 (two) times daily. 09/19/17   [provider]  rivaroxaban (XARELTO) 20 MG TABS tablet Take 20 mg by mouth daily. 09/18/17   [provider]  vitamin B-12 (CYANOCOBALAMIN) 1000 MCG tablet Take 1,000 mcg by mouth every morning. 09/19/17   [provider]    Family History Family History  Problem Relation Age of Onset  . Heart failure Mother   . Heart failure Father     Social History Social History   Tobacco Use  . Smoking status: Former Smoker    Last attempt to quit: 11/09/2013    Years since quitting: 5.3  . Smokeless tobacco: Never Used  Substance Use Topics  . Alcohol use: No  . Drug use: No     Allergies   Haloperidol; Heparin; Diazepam; and  Haloperidol lactate   Review of Systems Review of Systems  Constitutional: Negative for fever.  Respiratory: Negative for cough and shortness of breath.   Cardiovascular: Negative for chest pain.  Gastrointestinal: Negative for vomiting.     Physical Exam Updated Vital Signs BP (!) 179/69 (BP Location: Right Arm)   Pulse 63   Temp 98.4 F (36.9 C) (Oral)   Resp 20   SpO2 100%   Physical Exam Vitals signs and nursing note reviewed.  Constitutional:      General: She is not in acute distress.    Appearance: She is well-developed. She is not diaphoretic.  HENT:     Head: Normocephalic and atraumatic.  Neck:     Musculoskeletal: Normal range of motion.  Cardiovascular:     Rate and  Rhythm: Normal rate.  Pulmonary:     Effort: Pulmonary effort is normal. No respiratory distress.  Abdominal:     General: There is no distension.  Skin:    General: Skin is warm and dry.  Neurological:     Mental Status: She is alert and oriented to person, place, and time.      ED Treatments / Results  Labs (all labs ordered are listed, but only abnormal results are displayed) Labs Reviewed - No data to display  EKG None  Radiology No results found.  Procedures Procedures (including critical care time)  Medications Ordered in ED Medications  amLODipine (NORVASC) tablet 10 mg (has no administration in time range)     Initial Impression / Assessment and Plan / ED Course  I have reviewed the triage vital signs and the nursing notes.  Pertinent labs & imaging results that were available during my care of the patient were reviewed by me and considered in my medical decision making (see chart for details).        78 year old female with a history of hypertension, morbid obesity, prior respiratory failure now on 2 L of oxygen, homelessness, who presents reportedly at the request of social work for assistance in disposition for home oxygen.  Patient has home oxygen concentrator that needs to be plugged in--was discharged yesterday waiting in lobby and attempting to get to bus station but was brought back into ED reportedly by Child psychotherapist for help tomorrow.  No medical concerns.   I do not see a note from social work who has left for the day but given she does require O2 concentrator will allow her to stay tonight and tomorrow can organize discharge/transportation to bus when bus open.    Final Clinical Impressions(s) / ED Diagnoses   Final diagnoses:  Homeless    ED Discharge Orders    None       Alvira Monday, MD 03/06/19 2129

## 2019-03-06 NOTE — ED Notes (Signed)
Received report from [employer], pt belongings in pts room.  Pt is a boarder, 02  2l's [place] in place, concentrator.  A&O x 3, no distress noted, calm & cooperative.  Will continue to monitor, pending Social worker for am.

## 2019-03-06 NOTE — ED Notes (Signed)
Pt declined to allow belongings to be locked in another room (dayroom)

## 2019-03-07 NOTE — Progress Notes (Signed)
CSW spoke with TCU nurse about this patient needing transportation to the bus depot to get back to [place] where she is from. TCU nurse reports that patient does use a wheelchair, but does not have it. Patient is not able to ambulate, but reports patient stated she can get around on her own. CSW then spoke with patient. Patient stated she was ready to leave the hospital and requested to go to the bus depot to make arrangements to get back to [place]. Patient confirmed that she is able to get around on her own. CSW provided patient with a taxi voucher to the bus depot and gave her information to the [facility] on her AVS.   Geralyn Corwin, LCSW Transitions of Care Department [facility] ED [phone]

## 2019-04-04 DIAGNOSIS — R0602 Shortness of breath: Secondary | ICD-10-CM | POA: Diagnosis not present

## 2019-04-04 DIAGNOSIS — I10 Essential (primary) hypertension: Secondary | ICD-10-CM | POA: Diagnosis not present

## 2019-07-29 DIAGNOSIS — R0602 Shortness of breath: Secondary | ICD-10-CM | POA: Diagnosis not present

## 2019-11-03 ENCOUNTER — Other Ambulatory Visit: Payer: Self-pay

## 2019-11-03 DIAGNOSIS — R2243 Localized swelling, mass and lump, lower limb, bilateral: Secondary | ICD-10-CM | POA: Insufficient documentation

## 2019-11-03 DIAGNOSIS — R0602 Shortness of breath: Secondary | ICD-10-CM | POA: Diagnosis not present

## 2019-11-03 DIAGNOSIS — Z87891 Personal history of nicotine dependence: Secondary | ICD-10-CM | POA: Insufficient documentation

## 2019-11-03 DIAGNOSIS — Z79899 Other long term (current) drug therapy: Secondary | ICD-10-CM | POA: Diagnosis not present

## 2019-11-03 DIAGNOSIS — I10 Essential (primary) hypertension: Secondary | ICD-10-CM | POA: Diagnosis not present

## 2019-11-03 DIAGNOSIS — M79606 Pain in leg, unspecified: Secondary | ICD-10-CM | POA: Diagnosis present

## 2019-11-03 MED ORDER — DSS 100 MG PO CAPS
100.00 | ORAL_CAPSULE | ORAL | Status: DC
Start: ? — End: 2019-11-03

## 2019-11-03 MED ORDER — CLOTRIMAZOLE 1 % EX CREA
TOPICAL_CREAM | CUTANEOUS | Status: DC
Start: 2019-11-03 — End: 2019-11-03

## 2019-11-03 MED ORDER — FUROSEMIDE 40 MG PO TABS
40.00 | ORAL_TABLET | ORAL | Status: DC
Start: ? — End: 2019-11-03

## 2019-11-03 MED ORDER — AMLODIPINE BESYLATE 10 MG PO TABS
10.00 | ORAL_TABLET | ORAL | Status: DC
Start: 2019-11-04 — End: 2019-11-03

## 2019-11-04 ENCOUNTER — Emergency Department (HOSPITAL_COMMUNITY)
Admission: EM | Admit: 2019-11-04 | Discharge: 2019-11-04 | Disposition: A | Discharge status: Home / Self Care | Payer: Medicare HMO | Attending: Emergency Medicine | Admitting: Emergency Medicine

## 2019-11-04 ENCOUNTER — Other Ambulatory Visit: Payer: Self-pay

## 2019-11-04 ENCOUNTER — Encounter (HOSPITAL_COMMUNITY): Payer: Self-pay | Admitting: Emergency Medicine

## 2019-11-04 ENCOUNTER — Emergency Department (HOSPITAL_COMMUNITY): Payer: Medicare HMO

## 2019-11-04 DIAGNOSIS — R609 Edema, unspecified: Secondary | ICD-10-CM

## 2019-11-04 LAB — CBC
HCT: 31.9 % — ABNORMAL LOW (ref 36.0–46.0)
Hemoglobin: 9.6 g/dL — ABNORMAL LOW (ref 12.0–15.0)
MCH: 25.3 pg — ABNORMAL LOW (ref 26.0–34.0)
MCHC: 30.1 g/dL (ref 30.0–36.0)
MCV: 84.2 fL (ref 80.0–100.0)
Platelets: 310 10*3/uL (ref 150–400)
RBC: 3.79 MIL/uL — ABNORMAL LOW (ref 3.87–5.11)
RDW: 16.9 % — ABNORMAL HIGH (ref 11.5–15.5)
WBC: 5.9 10*3/uL (ref 4.0–10.5)
nRBC: 0 % (ref 0.0–0.2)

## 2019-11-04 LAB — BRAIN NATRIURETIC PEPTIDE: B Natriuretic Peptide: 77.6 pg/mL (ref 0.0–100.0)

## 2019-11-04 LAB — BASIC METABOLIC PANEL
Anion gap: 9 (ref 5–15)
BUN: 13 mg/dL (ref 8–23)
CO2: 27 mmol/L (ref 22–32)
Calcium: 8.5 mg/dL — ABNORMAL LOW (ref 8.9–10.3)
Chloride: 104 mmol/L (ref 98–111)
Creatinine, Ser: 1.07 mg/dL — ABNORMAL HIGH (ref 0.44–1.00)
GFR calc Af Amer: 58 mL/min — ABNORMAL LOW (ref >60)
GFR calc non Af Amer: 50 mL/min — ABNORMAL LOW (ref >60)
Glucose, Bld: 114 mg/dL — ABNORMAL HIGH (ref 70–99)
Potassium: 4 mmol/L (ref 3.5–5.1)
Sodium: 140 mmol/L (ref 135–145)

## 2019-11-04 MED ORDER — DOXYCYCLINE HYCLATE 100 MG PO CAPS: 100.0000 mg | ORAL_CAPSULE | Freq: Two times a day (BID) | ORAL | 0 refills | Status: DC

## 2019-11-04 MED ORDER — FUROSEMIDE 10 MG/ML IJ SOLN
80.0000 mg | Freq: Once | INTRAMUSCULAR | Status: AC
  Administered 2019-11-04: 80 mg via INTRAVENOUS
  Filled 2019-11-04: qty 8

## 2019-11-04 MED ORDER — SODIUM CHLORIDE 0.9% FLUSH
3.0000 mL | Freq: Once | INTRAVENOUS | Status: AC
  Administered 2019-11-04: 3 mL via INTRAVENOUS

## 2019-11-04 MED ORDER — HYDROCODONE-ACETAMINOPHEN 5-325 MG PO TABS: 1.0000 | ORAL_TABLET | ORAL | 0 refills | Status: AC | PRN

## 2019-11-04 MED ORDER — FUROSEMIDE 20 MG PO TABS: 20.0000 mg | ORAL_TABLET | Freq: Every day | ORAL | 0 refills | Status: DC

## 2019-11-04 NOTE — ED Notes (Signed)
Discharge instructions reviewed with pt. Pt verbalized understanding. Pt requesting social work, consult placed.

## 2019-11-04 NOTE — ED Triage Notes (Signed)
Patient here with shortness of breath and bilateral leg pain and edema from her knees down.  She states that it has been going on today.  She states that she has been living in a motel for the last 8 months.

## 2019-11-04 NOTE — ED Provider Notes (Signed)
[facility] EMERGENCY DEPARTMENT Provider Note   CSN: [id number] Arrival date & time: 11/03/19  2349     History Chief Complaint  Patient presents with  . Shortness of Breath  . Leg Pain    Beth Hall is a 79 y.o. female.  Patient presents to the emergency department for evaluation of shortness of breath and leg pain and swelling.  Patient reports that this is been ongoing for years.  She is currently living in a motel.  She has noticed that she gets more short of breath with exertion recently.  She does report, however, that this is happened in the past.  She is here mainly because her legs hurt.  She has had an increase in the amount of swelling in both of her legs.  She cannot tell me what time.  This has occurred over.  She has been on Lasix in the past but currently is not on any Lasix.  She is not experiencing any chest pain.        Past Medical History:  Diagnosis Date  . Hip fracture (HCC)   . Homeless   . Hypertension   . Morbid obesity (HCC)   . Pelvic fracture (HCC)   . Post-partum depression     Patient Active Problem List   Diagnosis Date Noted  . Pedal edema 06/03/2017  . Essential hypertension 06/03/2017  . Homelessness 06/03/2017  . Abrasions of multiple sites 06/03/2017    History reviewed. No pertinent surgical history.   OB History   No obstetric history on file.     Family History  Problem Relation Age of Onset  . Heart failure Mother   . Heart failure Father     Social History   Tobacco Use  . Smoking status: Former Smoker    Quit date: 11/09/2013    Years since quitting: 5.9  . Smokeless tobacco: Never Used  Substance Use Topics  . Alcohol use: No  . Drug use: No    Home Medications Prior to Admission medications   Medication Sig Start Date End Date Taking? Authorizing Provider  docusate sodium (COLACE) 100 MG capsule Take 100 mg by mouth 3 (three) times daily as needed for mild constipation.   Yes  [provider]  vitamin B-12 (CYANOCOBALAMIN) 1000 MCG tablet Take 1,000 mcg by mouth every morning. 09/19/17  Yes [provider]  acetaminophen (TYLENOL) 500 MG tablet Take 2 tablets (1,000 mg total) by mouth every 6 (six) hours as needed for moderate pain. 11/04/17   [name], MD  doxycycline (VIBRAMYCIN) 100 MG capsule Take 1 capsule (100 mg total) by mouth 2 (two) times daily. 11/04/19   Gilda Crease, MD  furosemide (LASIX) 20 MG tablet Take 1 tablet (20 mg total) by mouth daily. 11/04/19   Gilda Crease, MD    Allergies    Haloperidol, Heparin, Diazepam, and Haloperidol lactate  Review of Systems   Review of Systems  Respiratory: Positive for shortness of breath.   Cardiovascular: Positive for leg swelling.  All other systems reviewed and are negative.   Physical Exam Updated Vital Signs BP (!) 150/64 (BP Location: Right Arm)   Pulse 66   Temp 97.9 F (36.6 C) (Oral)   Resp 18   SpO2 100%   Physical Exam Vitals and nursing note reviewed.  Constitutional:      General: She is not in acute distress.    Appearance: Normal appearance. She is well-developed.  HENT:  Head: Normocephalic and atraumatic.     Right Ear: Hearing normal.     Left Ear: Hearing normal.     Nose: Nose normal.  Eyes:     Conjunctiva/sclera: Conjunctivae normal.     Pupils: Pupils are equal, round, and reactive to light.  Cardiovascular:     Rate and Rhythm: Regular rhythm.     Heart sounds: S1 normal and S2 normal. No murmur. No friction rub. No gallop.   Pulmonary:     Effort: Pulmonary effort is normal. No respiratory distress.     Breath sounds: Normal breath sounds.  Chest:     Chest wall: No tenderness.  Abdominal:     General: Bowel sounds are normal.     Palpations: Abdomen is soft.     Tenderness: There is no abdominal tenderness. There is no guarding or rebound. Negative signs include Murphy's sign and McBurney's sign.     Hernia: No hernia  is present.  Musculoskeletal:        General: Normal range of motion.     Cervical back: Normal range of motion and neck supple.     Right lower leg: Edema present.     Left lower leg: Edema present.  Skin:    General: Skin is warm and dry.     Findings: Erythema (Bilateral lower extremities) present. No rash.  Neurological:     Mental Status: She is alert and oriented to person, place, and time.     GCS: GCS eye subscore is 4. GCS verbal subscore is 5. GCS motor subscore is 6.     Cranial Nerves: No cranial nerve deficit.     Sensory: No sensory deficit.     Coordination: Coordination normal.  Psychiatric:        Speech: Speech normal.        Behavior: Behavior normal.        Thought Content: Thought content normal.     ED Results / Procedures / Treatments   Labs (all labs ordered are listed, but only abnormal results are displayed) Labs Reviewed  BASIC METABOLIC PANEL - Abnormal; Notable for the following components:      Result Value   Glucose, Bld 114 (*)    Creatinine, Ser 1.07 (*)    Calcium 8.5 (*)    GFR calc non Af Amer 50 (*)    GFR calc Af Amer 58 (*)    All other components within normal limits  CBC - Abnormal; Notable for the following components:   RBC 3.79 (*)    Hemoglobin 9.6 (*)    HCT 31.9 (*)    MCH 25.3 (*)    RDW 16.9 (*)    All other components within normal limits  BRAIN NATRIURETIC PEPTIDE    EKG EKG Interpretation  Date/Time:  Friday November 04 2019 00:08:03 EST Ventricular Rate:  82 PR Interval:  138 QRS Duration: 76 QT Interval:  398 QTC Calculation: 464 R Axis:   37 Text Interpretation: Normal sinus rhythm Nonspecific T wave abnormality Abnormal ECG No significant change since last tracing Confirmed by Gilda Crease [phone]) on 11/04/2019 1:42:31 AM   Radiology DG Chest 2 View  Result Date: 11/04/2019 CLINICAL DATA:  Shortness of breath EXAM: CHEST - 2 VIEW COMPARISON:  November 03, 2017 FINDINGS: The heart size and  mediastinal contours unchanged with mild cardiomegaly. Aortic knob calcifications. Prominence of the central pulmonary vasculature. No large airspace consolidation or pleural effusion. IMPRESSION: Mild cardiomegaly and pulmonary vascular congestion. Electronically  Signed   By: Prudencio Pair M.D.   On: 11/04/2019 00:46    Procedures Procedures (including critical care time)  Medications Ordered in ED Medications  sodium chloride flush (NS) 0.9 % injection 3 mL (3 mLs Intravenous Given 11/04/19 0226)  furosemide (LASIX) injection 80 mg (80 mg Intravenous Given 11/04/19 0221)    ED Course  I have reviewed the triage vital signs and the nursing notes.  Pertinent labs & imaging results that were available during my care of the patient were reviewed by me and considered in my medical decision making (see chart for details).    MDM Rules/Calculators/A&P                      Patient presents with shortness of breath and leg swelling.  She reports that these symptoms have been recurrent for her over the last few years.  She tells me that she has had CAT scans of her chest, ultrasounds of her legs and he has had never shown anything.  She declines undergoing any of these type of procedures tonight.  I did discuss with her the possibility of blood clot in her legs, blood clot in her lungs and recommended at least CT of the chest to rule this out.  She reports "I am not going to go through that tonight".  She understands the life-threatening processes that I am looking for and declines any further work-up.  She likely just has significant edema causing a stasis dermatitis of both of her legs.  Chest x-ray shows vascular congestion but no pulmonary edema.  Remainder of her blood work is unremarkable.  Diuresis initiated here in the ER.  She reports that she has not on Lasix currently.  Records indicate that she has been on 20 mg daily in the past.  Will reinitiate diuretics, patient will need to follow-up with  primary care.  She does have some erythema of the legs which I believe is secondary to peripheral edema but will also cover with antibiotics.  Final Clinical Impression(s) / ED Diagnoses Final diagnoses:  Edema, unspecified type    Rx / DC Orders ED Discharge Orders         Ordered    furosemide (LASIX) 20 MG tablet  Daily     11/04/19 0631    doxycycline (VIBRAMYCIN) 100 MG capsule  2 times daily     11/04/19 0631           Orpah Greek, MD 11/04/19 [phone]

## 2019-11-15 ENCOUNTER — Ambulatory Visit (HOSPITAL_COMMUNITY)
Admission: EM | Admit: 2019-11-15 | Discharge: 2019-11-15 | Disposition: A | Discharge status: Home / Self Care | Payer: Medicare HMO | Attending: Urgent Care | Admitting: Urgent Care

## 2019-11-15 ENCOUNTER — Other Ambulatory Visit: Payer: Self-pay

## 2019-11-15 ENCOUNTER — Encounter (HOSPITAL_COMMUNITY): Payer: Self-pay

## 2019-11-15 DIAGNOSIS — Z5900 Homelessness unspecified: Secondary | ICD-10-CM

## 2019-11-15 DIAGNOSIS — I10 Essential (primary) hypertension: Secondary | ICD-10-CM

## 2019-11-15 DIAGNOSIS — R609 Edema, unspecified: Secondary | ICD-10-CM

## 2019-11-15 DIAGNOSIS — R030 Elevated blood-pressure reading, without diagnosis of hypertension: Secondary | ICD-10-CM

## 2019-11-15 DIAGNOSIS — Z993 Dependence on wheelchair: Secondary | ICD-10-CM

## 2019-11-15 DIAGNOSIS — Z5189 Encounter for other specified aftercare: Secondary | ICD-10-CM

## 2019-11-15 MED ORDER — FUROSEMIDE 40 MG PO TABS: 40.0000 mg | ORAL_TABLET | Freq: Every day | ORAL | 0 refills | Status: AC

## 2019-11-15 MED ORDER — POTASSIUM CHLORIDE ER 10 MEQ PO TBCR: 10.0000 meq | EXTENDED_RELEASE_TABLET | Freq: Every day | ORAL | 0 refills | Status: AC

## 2019-11-15 MED ORDER — CEPHALEXIN 500 MG PO CAPS: 500.0000 mg | ORAL_CAPSULE | Freq: Two times a day (BID) | ORAL | 0 refills | Status: AC

## 2019-11-15 MED FILL — POTASSIUM CHLORIDE CRYS ER: 10 | 30 days supply | Qty: 30 | Fill #0

## 2019-11-15 MED FILL — FUROSEMIDE 40 MG TAB: 40 | 30 days supply | Qty: 30 | Fill #0

## 2019-11-15 MED FILL — CEPHALEXIN 500 MG CAPSULE: 500 | 15 days supply | Qty: 30 | Fill #0

## 2019-11-15 NOTE — ED Provider Notes (Signed)
[place]   MRN: [id number] DOB: 1941/03/08  Subjective:   Beth Hall is a 79 y.o. female presenting for recheck on her lower leg wounds.  Patient has had multiple visits this month for the same.  She is currently supposed to be taking doxycycline which she reports having a difficult time with due to nausea and vomiting.  She is also supposed to be on furosemide but has not been taking this.  Unfortunately, patient is currently homeless and has a difficult living situation.  Her daughters have tried to set her up in different motels and she has also applied for housing but this is following through.  She does not have a PCP.  Denies having chest pain.  She does have chronic shortness of breath and uses an oxygen tank.   Current Facility-Administered Medications:  .  bacitracin ointment 1 application, 1 application, Topical, Once, Hairston, Mandesia R, FNP  Current Outpatient Medications:  .  acetaminophen (TYLENOL) 500 MG tablet, Take 2 tablets (1,000 mg total) by mouth every 6 (six) hours as needed for moderate pain., Disp: 60 tablet, Rfl: 2 .  docusate sodium (COLACE) 100 MG capsule, Take 100 mg by mouth 3 (three) times daily as needed for mild constipation., Disp: , Rfl:  .  doxycycline (VIBRAMYCIN) 100 MG capsule, Take 1 capsule (100 mg total) by mouth 2 (two) times daily., Disp: 20 capsule, Rfl: 0 .  furosemide (LASIX) 20 MG tablet, Take 1 tablet (20 mg total) by mouth daily., Disp: 30 tablet, Rfl: 0 .  HYDROcodone-acetaminophen (NORCO/VICODIN) 5-325 MG tablet, Take 1 tablet by mouth every 4 (four) hours as needed for moderate pain., Disp: 10 tablet, Rfl: 0 .  vitamin B-12 (CYANOCOBALAMIN) 1000 MCG tablet, Take 1,000 mcg by mouth every morning., Disp: , Rfl:    Allergies  Allergen Reactions  . Haloperidol Anaphylaxis    catatonia  catatonia   . Heparin Itching and Nausea And Vomiting    blisters  . Diazepam Other (See Comments) and Nausea And Vomiting    Other  Reaction: Dry mouth Other Reaction: Dry mouth Other Reaction: Dry mouth Other Reaction: Dry mouth Dry mouth   . Haloperidol Lactate Other (See Comments) and Anxiety    CATATONIC EPISODES Other reaction(s): Psychosis (intolerance) CATATONIC EPISODES Other Reaction: Not Assessed     Past Medical History:  Diagnosis Date  . Hip fracture ([place])   . Homeless   . Hypertension   . Morbid obesity ([place])   . Pelvic fracture ([place])   . Post-partum depression      History reviewed. No pertinent surgical history.   Family History  Problem Relation Age of Onset  . Heart failure Mother   . Heart failure Father     Social History   Tobacco Use  . Smoking status: Former Smoker    Quit date: 11/09/2013    Years since quitting: 6.0  . Smokeless tobacco: Never Used  Substance Use Topics  . Alcohol use: No  . Drug use: No    ROS   Objective:   Vitals: BP (!) 167/54 (BP Location: Right Arm)   Pulse 61   Temp 98 F (36.7 C) (Oral)   Resp 18   Wt 260 lb (117.9 kg)   SpO2 100%   BMI 47.55 kg/m   BP Readings from Last 3 Encounters:  11/15/19 (!) 167/54  11/04/19 (!) 150/64  03/07/19 (!) 149/55    Physical Exam Constitutional:      General: She is not in  acute distress.    Appearance: Normal appearance. She is well-developed. She is obese. She is not ill-appearing, toxic-appearing or diaphoretic.  HENT:     Head: Normocephalic and atraumatic.     Nose: Nose normal.     Mouth/Throat:     Mouth: Mucous membranes are moist.  Eyes:     Extraocular Movements: Extraocular movements intact.     Pupils: Pupils are equal, round, and reactive to light.  Cardiovascular:     Rate and Rhythm: Normal rate and regular rhythm.     Pulses: Normal pulses.     Heart sounds: Normal heart sounds. No murmur. No friction rub. No gallop.   Pulmonary:     Effort: Pulmonary effort is normal. No respiratory distress.     Breath sounds: Normal breath sounds. No stridor. No wheezing,  rhonchi or rales.  Musculoskeletal:     Right lower leg: Edema present.     Left lower leg: Edema present.  Skin:    General: Skin is warm and dry.     Findings: No rash.  Neurological:     Mental Status: She is alert and oriented to person, place, and time.  Psychiatric:        Mood and Affect: Mood normal.        Behavior: Behavior normal.        Thought Content: Thought content normal.        Judgment: Judgment normal.        Recent Results (from the past 2160 hour(s))  Basic metabolic panel     Status: Abnormal   Collection Time: 11/04/19 12:17 AM  Result Value Ref Range   Sodium 140 135 - 145 mmol/L   Potassium 4.0 3.5 - 5.1 mmol/L   Chloride 104 98 - 111 mmol/L   CO2 27 22 - 32 mmol/L   Glucose, Bld 114 (H) 70 - 99 mg/dL   BUN 13 8 - 23 mg/dL   Creatinine, Ser 1.01 (H) 0.44 - 1.00 mg/dL   Calcium 8.5 (L) 8.9 - 10.3 mg/dL   GFR calc non Af Amer 50 (L) >60 mL/min   GFR calc Af Amer 58 (L) >60 mL/min   Anion gap 9 5 - 15    Comment: Performed at [facility], [address] [place], [place] [address]  CBC     Status: Abnormal   Collection Time: 11/04/19 12:17 AM  Result Value Ref Range   WBC 5.9 4.0 - 10.5 K/uL   RBC 3.79 (L) 3.87 - 5.11 MIL/uL   Hemoglobin 9.6 (L) 12.0 - 15.0 g/dL   HCT 58.5 (L) 27.7 - 82.4 %   MCV 84.2 80.0 - 100.0 fL   MCH 25.3 (L) 26.0 - 34.0 pg   MCHC 30.1 30.0 - 36.0 g/dL   RDW 23.5 (H) 36.1 - 44.3 %   Platelets 310 150 - 400 K/uL   nRBC 0.0 0.0 - 0.2 %    Comment: Performed at [facility], [address], [place], [place] [address]  Brain natriuretic peptide     Status: None   Collection Time: 11/04/19 12:17 AM  Result Value Ref Range   B Natriuretic Peptide 77.6 0.0 - 100.0 pg/mL    Comment: Performed at [facility], [address], [place], [place] [address]     Assessment and Plan :   1. Visit for wound check   2. Peripheral edema   3. Essential hypertension   4. Elevated blood pressure  reading   5.  Wheelchair bound   6. Homelessness     Counseled patient on wound care.  Will have her stop doxycycline and switch to Keflex.  Patient is to contact [facility] to get help establishing care with a new PCP.  At this time I do not believe patient has at risk for heart failure given clear lung sounds and negative BNP on 11/04/2019. Counseled patient on potential for adverse effects with medications prescribed/recommended today, strict ER and return-to-clinic precautions discussed, patient verbalized understanding.    Wallis Bamberg, PA-C 11/15/19 1404

## 2019-11-15 NOTE — Discharge Instructions (Addendum)
Please make sure you contact [facility] to get help with your chronic conditions including your oxygen, management of your high blood pressure. For now, take Lasix to help with your lower leg edema.  I am giving you a low dose of potassium supplementation to help avoid low potassium from taking Lasix.  Make sure you change her dressings 2-3 times a day.  If you develop chest pain, worsening shortness of breath then please report to the emergency room as this may be a sign of heart failure or heart event related to your uncontrolled high blood pressure.   For diabetes, please make sure you are avoiding starchy, carbohydrate foods like pasta, breads, pastry, rice, potatoes, desserts. These foods can elevated your blood sugar. Also, limit your alcohol drinking to 1 per day, avoid sodas, sweet teas. For elevated blood pressure, make sure you are monitoring salt in your diet.  Do not eat restaurant foods and limit processed foods at home.  Processed foods include things like frozen meals preseason meats and dinners.  Make sure your pain attention to sodium labels on foods you by at the grocery store.  For seasoning you can use a brand called Mrs. Dash which includes a lot of salt free seasonings.  Salads - kale, spinach, cabbage, spring mix; use seeds like pumpkin seeds or sunflower seeds, almonds; you can also use 1-2 hard boiled eggs in your salads Fruits - avocadoes, berries (blueberries, raspberries, blackberries), apples, oranges Vegetables - aspargus, cauliflower, broccoli, green beans, brussel spouts, bell peppers; stay away from starchy vegetables like potatoes, carrots, peas  Regarding meat it is better to eat lean meats and limit your red meat consumption including pork.  Wild caught fish, chicken breast are good options.  Do not eat any foods on this list that you are allergic to.

## 2019-11-15 NOTE — ED Triage Notes (Signed)
Pt states she has wounds on her legs. Pt states her legs may be infected. Pt states she legs have been like this sense of Oct 20, 2019

## 2019-11-17 ENCOUNTER — Emergency Department (HOSPITAL_COMMUNITY)
Admission: EM | Admit: 2019-11-17 | Discharge: 2019-11-18 | Disposition: A | Discharge status: Home / Self Care | Payer: Medicare HMO | Attending: Emergency Medicine | Admitting: Emergency Medicine

## 2019-11-17 ENCOUNTER — Encounter (HOSPITAL_COMMUNITY): Payer: Self-pay | Admitting: Emergency Medicine

## 2019-11-17 ENCOUNTER — Other Ambulatory Visit: Payer: Self-pay

## 2019-11-17 DIAGNOSIS — J810 Acute pulmonary edema: Secondary | ICD-10-CM | POA: Insufficient documentation

## 2019-11-17 DIAGNOSIS — R0602 Shortness of breath: Secondary | ICD-10-CM | POA: Diagnosis present

## 2019-11-17 DIAGNOSIS — I10 Essential (primary) hypertension: Secondary | ICD-10-CM | POA: Insufficient documentation

## 2019-11-17 DIAGNOSIS — Z7901 Long term (current) use of anticoagulants: Secondary | ICD-10-CM | POA: Insufficient documentation

## 2019-11-17 DIAGNOSIS — Z86711 Personal history of pulmonary embolism: Secondary | ICD-10-CM | POA: Insufficient documentation

## 2019-11-17 DIAGNOSIS — R609 Edema, unspecified: Secondary | ICD-10-CM | POA: Diagnosis not present

## 2019-11-17 DIAGNOSIS — Z87891 Personal history of nicotine dependence: Secondary | ICD-10-CM | POA: Diagnosis not present

## 2019-11-17 NOTE — ED Triage Notes (Signed)
Patient is requesting prescriptions for her hypertension and Lasix .

## 2019-11-18 ENCOUNTER — Emergency Department (HOSPITAL_COMMUNITY): Payer: Medicare HMO

## 2019-11-18 LAB — BASIC METABOLIC PANEL
Anion gap: 9 (ref 5–15)
BUN: 19 mg/dL (ref 8–23)
CO2: 26 mmol/L (ref 22–32)
Calcium: 8.7 mg/dL — ABNORMAL LOW (ref 8.9–10.3)
Chloride: 109 mmol/L (ref 98–111)
Creatinine, Ser: 1.18 mg/dL — ABNORMAL HIGH (ref 0.44–1.00)
GFR calc Af Amer: 52 mL/min — ABNORMAL LOW (ref >60)
GFR calc non Af Amer: 45 mL/min — ABNORMAL LOW (ref >60)
Glucose, Bld: 135 mg/dL — ABNORMAL HIGH (ref 70–99)
Potassium: 3.8 mmol/L (ref 3.5–5.1)
Sodium: 144 mmol/L (ref 135–145)

## 2019-11-18 LAB — CBC
HCT: 35 % — ABNORMAL LOW (ref 36.0–46.0)
Hemoglobin: 10.3 g/dL — ABNORMAL LOW (ref 12.0–15.0)
MCH: 25.1 pg — ABNORMAL LOW (ref 26.0–34.0)
MCHC: 29.4 g/dL — ABNORMAL LOW (ref 30.0–36.0)
MCV: 85.2 fL (ref 80.0–100.0)
Platelets: 291 10*3/uL (ref 150–400)
RBC: 4.11 MIL/uL (ref 3.87–5.11)
RDW: 17.2 % — ABNORMAL HIGH (ref 11.5–15.5)
WBC: 5.4 10*3/uL (ref 4.0–10.5)
nRBC: 0 % (ref 0.0–0.2)

## 2019-11-18 LAB — BRAIN NATRIURETIC PEPTIDE: B Natriuretic Peptide: 48.6 pg/mL (ref 0.0–100.0)

## 2019-11-18 MED ORDER — FUROSEMIDE 10 MG/ML IJ SOLN
40.0000 mg | Freq: Once | INTRAMUSCULAR | Status: AC
  Administered 2019-11-18: 03:00:00 40 mg via INTRAVENOUS
  Filled 2019-11-18: qty 4

## 2019-11-18 MED ORDER — FUROSEMIDE 40 MG PO TABS: 40.0000 mg | ORAL_TABLET | Freq: Every day | ORAL | 0 refills | Status: AC

## 2019-11-18 NOTE — ED Provider Notes (Signed)
TIME SEEN: 1:29 AM  CHIEF COMPLAINT: Shortness of breath, lower extremity swelling  HPI: Patient is a 79 year old female with history of hypertension, PE on Xarelto, homelessness who presents to the emergency department bilateral lower extremity swelling, shortness of breath.  She is requesting a refill of Lasix and her blood pressure medication.  States that these medications have been provided to her by other ER physicians and urgent cares.  They have not been prescribed by her primary care doctor.  She states she has been without medication since January 14.  She is unable to tell me the name of her blood pressure medication or the dose.  States it was last filled at [facility].  Also reports that she has been more short of breath.  She initially told that her shortness of breath was chronic but then states it has been worse over the past 2 days.  She is on 2 L of oxygen at all times.  Denies chest pain or chest discomfort.  No fever.  On review of outside hospital records, it appears patient was prescribed amlodipine 10 mg for 30 tablets on 11/09/2019 and doxycycline 100 mg to take twice a day for 10 days on 11/08/2019.  Patient reports she is living with her granddaughter in a hotel.  She states that her and her granddaughter had a fight tonight prompting her to leave.  She decided then to come to the emergency department.  I have seen this patient previously and she is noted to have chronic lower extremity edema.  This is also noted in multiple previous ED visits within our hospital system and with and others.    ROS: See HPI, limited as patient is an extremely poor historian Constitutional: no fever  Eyes: no drainage  ENT: no runny nose   Cardiovascular:  no chest pain  Resp:  SOB  GI: no vomiting GU: no dysuria Integumentary: no rash  Allergy: no hives  Musculoskeletal:  leg swelling  Neurological: no slurred speech ROS otherwise negative  PAST MEDICAL HISTORY/PAST SURGICAL  HISTORY:  Past Medical History:  Diagnosis Date  . Hip fracture (HCC)   . Homeless   . Hypertension   . Morbid obesity (HCC)   . Pelvic fracture (HCC)   . Post-partum depression     MEDICATIONS:  Prior to Admission medications   Medication Sig Start Date End Date Taking? Authorizing Provider  acetaminophen (TYLENOL) 500 MG tablet Take 2 tablets (1,000 mg total) by mouth every 6 (six) hours as needed for moderate pain. 11/04/17   [name], MD  cephALEXin (KEFLEX) 500 MG capsule Take 1 capsule (500 mg total) by mouth 2 (two) times daily. 11/15/19   Wallis Bamberg, PA-C  docusate sodium (COLACE) 100 MG capsule Take 100 mg by mouth 3 (three) times daily as needed for mild constipation.    [provider]  furosemide (LASIX) 40 MG tablet Take 1 tablet (40 mg total) by mouth daily. 11/15/19   Wallis Bamberg, PA-C  HYDROcodone-acetaminophen (NORCO/VICODIN) 5-325 MG tablet Take 1 tablet by mouth every 4 (four) hours as needed for moderate pain. 11/04/19   Gilda Crease, MD  potassium chloride (KLOR-CON) 10 MEQ tablet Take 1 tablet (10 mEq total) by mouth daily. 11/15/19   Wallis Bamberg, PA-C  vitamin B-12 (CYANOCOBALAMIN) 1000 MCG tablet Take 1,000 mcg by mouth every morning. 09/19/17   [provider]    ALLERGIES:  Allergies  Allergen Reactions  . Haloperidol Anaphylaxis    catatonia  catatonia   .  Heparin Itching and Nausea And Vomiting    blisters  . Diazepam Other (See Comments) and Nausea And Vomiting    Other Reaction: Dry mouth Other Reaction: Dry mouth Other Reaction: Dry mouth Other Reaction: Dry mouth Dry mouth   . Haloperidol Lactate Other (See Comments) and Anxiety    CATATONIC EPISODES Other reaction(s): Psychosis (intolerance) CATATONIC EPISODES Other Reaction: Not Assessed     SOCIAL HISTORY:  Social History   Tobacco Use  . Smoking status: Former Smoker    Quit date: 11/09/2013    Years since quitting: 6.0  . Smokeless tobacco: Never Used   Substance Use Topics  . Alcohol use: No    FAMILY HISTORY: Family History  Problem Relation Age of Onset  . Heart failure Mother   . Heart failure Father     EXAM: BP (!) 139/52   Pulse 74   Temp 98 F (36.7 C) (Oral)   Resp 20   SpO2 96%  CONSTITUTIONAL: Alert and oriented and responds appropriately to questions.  Elderly.  Extremely poor historian.  Obese, in no distress HEAD: Normocephalic EYES: Conjunctivae clear, pupils appear equal, EOM appear intact ENT: normal nose; moist mucous membranes NECK: Supple, normal ROM CARD: RRR; S1 and S2 appreciated; no murmurs, no clicks, no rubs, no gallops RESP: Normal chest excursion without splinting or tachypnea; breath sounds clear and equal bilaterally; no wheezes, no rhonchi, no rales, no hypoxia or respiratory distress, speaking full sentences, on 2 L nasal cannula ABD/GI: Normal bowel sounds; non-distended; soft, non-tender, no rebound, no guarding, no peritoneal signs, no hepatosplenomegaly BACK:  The back appears normal EXT: Normal ROM in all joints; no deformity noted, pitting edema to the knees bilaterally with some open blisters but no redness, warmth, purulent drainage or bleeding, extremities are warm and well perfused, no calf tenderness, compartments in the legs are soft SKIN: Normal color for age and race; warm; no rash on exposed skin NEURO: Moves all extremities equally PSYCH: The patient's mood and manner are appropriate.   MEDICAL DECISION MAKING: Patient here with chronic peripheral edema.  It is unclear if she is having increased shortness of breath from baseline or not.  Patient initially very defensive and argumentative regarding her chronic illnesses.  She reports she has follow-up with a PCP scheduled on Monday at 1 PM at [facility] internal medicine.  She is unable to tell me what blood pressure medication she takes.  It appears she has previously been on amlodipine and hydrochlorothiazide but currently her blood  pressure is 139/52 with a heart rate of 74.  She does appear volume overloaded on exam but she chronically appears this way.  Given she is extremely poor historian, unreliable, will check labs, chest x-ray and EKG today.  She denies having any chest pain or chest discomfort.  She has recently been seen several times over the last week at [facility] and was given a prescription for amlodipine 10 mg for 30 tablets on the 20th.  ED PROGRESS: Patient's BNP is normal but this is in the setting of obesity and may be falsely low.  Will give 1 week prescription of Lasix given pulmonary edema seen on chest x-ray.  I do not feel she needs admission as she has no hypoxia, respiratory failure or distress, increased work of breathing.  Have recommended she follow-up with her primary care physician as scheduled on Monday at 1 PM.  Blood pressures have been stable here.  It appears she was given a prescription for her  amlodipine 10 mg on January 20.  I feel she is safe for discharge.   At this time, I do not feel there is any life-threatening condition present. I have reviewed, interpreted and discussed all results (EKG, imaging, lab, urine as appropriate) and exam findings with patient/family. I have reviewed nursing notes and appropriate previous records.  I feel the patient is safe to be discharged home without further emergent workup and can continue workup as an outpatient as needed. Discussed usual and customary return precautions. Patient/family verbalize understanding and are comfortable with this plan.  Outpatient follow-up has been provided as needed. All questions have been answered.    EKG Interpretation  Date/Time:  Friday November 18 2019 01:38:51 EST Ventricular Rate:  71 PR Interval:    QRS Duration: 83 QT Interval:  410 QTC Calculation: 446 R Axis:   45 Text Interpretation: Sinus rhythm Borderline T abnormalities, diffuse leads Baseline wander in lead(s) II III aVF No significant change  since last tracing Confirmed by Rochele Raring [phone]) on 11/18/2019 1:48:11 AM         Wallace Going was evaluated in Emergency Department on 11/18/2019 for the symptoms described in the history of present illness. She was evaluated in the context of the global COVID-19 pandemic, which necessitated consideration that the patient might be at risk for infection with the SARS-CoV-2 virus that causes COVID-19. Institutional protocols and algorithms that pertain to the evaluation of patients at risk for COVID-19 are in a state of rapid change based on information released by regulatory bodies including the CDC and federal and state organizations. These policies and algorithms were followed during the patient's care in the ED.  Patient was seen wearing N95, face shield, gloves.    Maurita Havener, Layla Maw, DO 11/18/19 [phone]

## 2019-11-18 NOTE — Discharge Instructions (Signed)
Please follow-up with internal medicine as scheduled at 1 PM.  Your amlodipine which is a blood pressure medication was refilled by the doctors at [facility] on January 20.  I have given you a prescription for 1 week of Lasix.  This will need to be refilled by your primary care doctor.  You may wear compression stockings to help with the swelling in your legs.

## 2019-11-18 NOTE — ED Notes (Signed)
Patient with dressings to bilateral lower legs.  States redness and discomfort to both since being diagnosed with cellulitis.  Patient states she has no antibiotics and cannot afford the ones she was supposed to get.

## 2019-11-22 ENCOUNTER — Ambulatory Visit: Payer: Medicare HMO

## 2020-10-20 DEATH — deceased
